# Patient Record
Sex: Male | Born: 1990 | Race: White | Hispanic: No | Marital: Single | Smoking: Never smoker
Health system: Southern US, Community
[De-identification: ages and names within clinical notes are randomized; demographics above are authoritative.]

---
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
## 2009-09-28 HISTORY — PX: BACK SURGERY: SHX140

## 2015-10-23 ENCOUNTER — Encounter: Payer: Self-pay | Admitting: Emergency Medicine

## 2015-10-23 ENCOUNTER — Emergency Department
Admission: EM | Admit: 2015-10-23 | Discharge: 2015-10-23 | Disposition: A | Discharge status: Home / Self Care | Payer: BLUE CROSS/BLUE SHIELD | Attending: Emergency Medicine | Admitting: Emergency Medicine

## 2015-10-23 ENCOUNTER — Emergency Department: Payer: BLUE CROSS/BLUE SHIELD

## 2015-10-23 DIAGNOSIS — R1031 Right lower quadrant pain: Secondary | ICD-10-CM | POA: Insufficient documentation

## 2015-10-23 DIAGNOSIS — R39198 Other difficulties with micturition: Secondary | ICD-10-CM | POA: Diagnosis not present

## 2015-10-23 DIAGNOSIS — Z79899 Other long term (current) drug therapy: Secondary | ICD-10-CM | POA: Diagnosis not present

## 2015-10-23 LAB — COMPREHENSIVE METABOLIC PANEL
ALBUMIN: 4.8 g/dL (ref 3.5–5.0)
ALK PHOS: 64 U/L (ref 38–126)
ALT: 19 U/L (ref 17–63)
ANION GAP: 6 (ref 5–15)
AST: 22 U/L (ref 15–41)
BILIRUBIN TOTAL: 0.8 mg/dL (ref 0.3–1.2)
BUN: 20 mg/dL (ref 6–20)
CALCIUM: 9.1 mg/dL (ref 8.9–10.3)
CO2: 24 mmol/L (ref 22–32)
Chloride: 104 mmol/L (ref 101–111)
Creatinine, Ser: 1.13 mg/dL (ref 0.61–1.24)
GFR calc non Af Amer: >60 mL/min (ref >60)
GLUCOSE: 103 mg/dL — AB (ref 65–99)
POTASSIUM: 3.8 mmol/L (ref 3.5–5.1)
SODIUM: 134 mmol/L — AB (ref 135–145)
TOTAL PROTEIN: 8.3 g/dL — AB (ref 6.5–8.1)

## 2015-10-23 LAB — CBC WITH DIFFERENTIAL/PLATELET
BASOS ABS: 0.1 10*3/uL (ref 0–0.1)
Basophils Relative: 1 %
EOS PCT: 7 %
Eosinophils Absolute: 0.4 10*3/uL (ref 0–0.7)
HCT: 45 % (ref 40.0–52.0)
Hemoglobin: 15.7 g/dL (ref 13.0–18.0)
LYMPHS PCT: 31 %
Lymphs Abs: 1.9 10*3/uL (ref 1.0–3.6)
MCH: 29 pg (ref 26.0–34.0)
MCHC: 34.8 g/dL (ref 32.0–36.0)
MCV: 83.2 fL (ref 80.0–100.0)
Monocytes Absolute: 0.3 10*3/uL (ref 0.2–1.0)
Monocytes Relative: 5 %
NEUTROS ABS: 3.4 10*3/uL (ref 1.4–6.5)
Neutrophils Relative %: 56 %
Platelets: 291 10*3/uL (ref 150–440)
RBC: 5.4 MIL/uL (ref 4.40–5.90)
RDW: 12.7 % (ref 11.5–14.5)
WBC: 6.1 10*3/uL (ref 3.8–10.6)

## 2015-10-23 LAB — URINALYSIS COMPLETE WITH MICROSCOPIC (ARMC ONLY)
BACTERIA UA: NONE SEEN
Bilirubin Urine: NEGATIVE
Glucose, UA: NEGATIVE mg/dL
Hgb urine dipstick: NEGATIVE
Ketones, ur: NEGATIVE mg/dL
Leukocytes, UA: NEGATIVE
Nitrite: NEGATIVE
PROTEIN: NEGATIVE mg/dL
SPECIFIC GRAVITY, URINE: 1.023 (ref 1.005–1.030)
SQUAMOUS EPITHELIAL / LPF: NONE SEEN
WBC UA: NONE SEEN WBC/hpf (ref 0–5)
pH: 8 (ref 5.0–8.0)

## 2015-10-23 LAB — LIPASE, BLOOD: LIPASE: 23 U/L (ref 11–51)

## 2015-10-23 MED ORDER — IBUPROFEN 100 MG/5ML PO SUSP: 400.0000 mg | ORAL | Status: DC | PRN

## 2015-10-23 MED ORDER — SODIUM CHLORIDE 0.9 % IV BOLUS (SEPSIS)
1000.0000 mL | Freq: Once | INTRAVENOUS | Status: AC
  Administered 2015-10-23: 1000 mL via INTRAVENOUS

## 2015-10-23 NOTE — Discharge Instructions (Signed)
Abdominal Pain, Adult °Many things can cause abdominal pain. Usually, abdominal pain is not caused by a disease and will improve without treatment. It can often be observed and treated at home. Your health care provider will do a physical exam and possibly order blood tests and X-rays to help determine the seriousness of your pain. However, in many cases, more time must pass before a clear cause of the pain can be found. Before that point, your health care provider Dayrit not know if you need more testing or further treatment. °HOME CARE INSTRUCTIONS °Monitor your abdominal pain for any changes. The following actions Horne help to alleviate any discomfort you are experiencing: °· Only take over-the-counter or prescription medicines as directed by your health care provider. °· Do not take laxatives unless directed to do so by your health care provider. °· Try a clear liquid diet (broth, tea, or water) as directed by your health care provider. Slowly move to a bland diet as tolerated. °SEEK MEDICAL CARE IF: °· You have unexplained abdominal pain. °· You have abdominal pain associated with nausea or diarrhea. °· You have pain when you urinate or have a bowel movement. °· You experience abdominal pain that wakes you in the night. °· You have abdominal pain that is worsened or improved by eating food. °· You have abdominal pain that is worsened with eating fatty foods. °· You have a fever. °SEEK IMMEDIATE MEDICAL CARE IF: °· Your pain does not go away within 2 hours. °· You keep throwing up (vomiting). °· Your pain is felt only in portions of the abdomen, such as the right side or the left lower portion of the abdomen. °· You pass bloody or black tarry stools. °MAKE SURE YOU: °· Understand these instructions. °· Will watch your condition. °· Will get help right away if you are not doing well or get worse. °  °This information is not intended to replace advice given to you by your health care provider. Make sure you discuss  any questions you have with your health care provider. °  °Document Released: 06/24/2005 Document Revised: 06/05/2015 Document Reviewed: 05/24/2013 °Elsevier Interactive Patient Education ©2016 [employer] ° °

## 2015-10-23 NOTE — ED Notes (Signed)
Pt to ed with c/o right flank pain and right groin pain x 3 days.  Pt reports increased difficulty with urination.

## 2015-10-23 NOTE — ED Provider Notes (Signed)
[facility] Emergency Department Provider Note  ____________________________________________  Time seen: Approximately 8 AM   I have reviewed the triage vital signs and the nursing notes.   HISTORY  Chief Complaint Groin Pain and Flank Pain    HPI Angel Montgomery is a 25 y.o. male without any chronic medical problems is presenting with 2-3 days of right flank and groin pain. He says he has also had difficulty urinating but denies any blood in his urine. He does have a strong history of kidney stones in his family. He denies any nausea or vomiting. Says that the pain is worsened with movement. Denies any diarrhea. 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 He denies seeing any bulges or obvious hernias. He says the pain as a 5 out of 10 right now but becomes sharp at times lasting for 10-15 minutes.     History reviewed. No pertinent past medical history.  There are no active problems to display for this patient.   History reviewed. No pertinent past surgical history.  Current Outpatient Rx  Name  Route  Sig  Dispense  Refill  . loratadine (CLARITIN) 10 MG tablet   Oral   Take 10 mg by mouth daily.           Allergies Review of patient's allergies indicates no known allergies.  History reviewed. No pertinent family history.  Social History Social History  Substance Use Topics  . Smoking status: Never Smoker   . Smokeless tobacco: None  . Alcohol Use: No    Review of Systems Constitutional: No fever/chills Eyes: No visual changes. ENT: No sore throat. Cardiovascular: Denies chest pain. Respiratory: Denies shortness of breath. Gastrointestinal: No nausea, no vomiting.  No diarrhea. He was also having some recent issues with constipation but took a dose of magnesium citrate which resolved the issue. Genitourinary: Negative for any burning or frequency on  urination. Musculoskeletal: Negative for back pain. Skin: Negative for rash. Neurological: Negative for headaches, focal weakness or numbness.  10-point ROS otherwise negative.  ____________________________________________   PHYSICAL EXAM:  VITAL SIGNS: ED Triage Vitals  Enc Vitals Group     BP 10/23/15 0756 142/76 mmHg     Pulse Rate 10/23/15 0756 99     Resp 10/23/15 0756 18     Temp 10/23/15 0756 98 F (36.7 C)     Temp Source 10/23/15 0756 Oral     SpO2 10/23/15 0756 99 %     Weight 10/23/15 0756 200 lb (90.719 kg)     Height 10/23/15 0756  (1.753 m)     Head Cir --      Peak Flow --      Pain Score 10/23/15 0753 6     Pain Loc --      Pain Edu? --      Excl. in GC? --     Constitutional: Alert and oriented. Well appearing and in no acute distress. Eyes: Conjunctivae are normal. PERRL. EOMI. Head: Atraumatic. Nose: No congestion/rhinnorhea. Mouth/Throat: Mucous membranes are moist.   Neck: No stridor.   Cardiovascular: Normal rate, regular rhythm. Grossly normal heart sounds.  Good peripheral circulation. Respiratory: Normal respiratory effort.  No retractions. Lungs CTAB. Gastrointestinal: Soft with mild right lower quadrant tenderness to palpation. No distention. No abdominal bruits. No CVA tenderness. Genitourinary: Normal external examination of the circumcised male. No obvious palpable or visible direct or indirect  inguinal hernia. No tenderness to the scrotum, testicles or masses. Musculoskeletal: No lower extremity tenderness nor edema.  No joint effusions. Neurologic:  Normal speech and language. No gross focal neurologic deficits are appreciated. No gait instability. Skin:  Skin is warm, dry and intact. No rash noted. Psychiatric: Mood and affect are normal. Speech and behavior are normal.  ____________________________________________   LABS (all labs ordered are listed, but only abnormal results are displayed)  Labs Reviewed  URINALYSIS  COMPLETEWITH MICROSCOPIC ([facility] ONLY) - Abnormal; Notable for the following:    Color, Urine YELLOW (*)    APPearance CLEAR (*)    All other components within normal limits  COMPREHENSIVE METABOLIC PANEL - Abnormal; Notable for the following:    Sodium 134 (*)    Glucose, Bld 103 (*)    Total Protein 8.3 (*)    All other components within normal limits  CBC WITH DIFFERENTIAL/PLATELET  LIPASE, BLOOD   ____________________________________________  EKG   ____________________________________________  RADIOLOGY  No acute abnormality seen on the CAT scan of the abdomen and pelvis. ____________________________________________   PROCEDURES    ____________________________________________   INITIAL IMPRESSION / ASSESSMENT AND PLAN / ED COURSE  Pertinent labs & imaging results that were available during my care of the patient were reviewed by me and considered in my medical decision making (see chart for details).  ----------------------------------------- 9:47 AM on 10/23/2015 -----------------------------------------  Patient is resting comfortably at this time. Updated him as well as his family about the lab as well as imaging results. Very reassuring workup at this time. Feel that the etiology is most likely abdominal wall pain. Possibly from the patient's job where he does a lot of lifting. Advised him to rest and to use ibuprofen as well as muscle cream such as Aspercreme or icy hot. He knows to return for any worsening or concerning symptoms. Will follow up with primary care doctor. ____________________________________________   FINAL CLINICAL IMPRESSION(S) / ED DIAGNOSES  Final diagnoses:  Right lower quadrant abdominal pain      Myrna Blazer, MD 10/23/15 [phone]

## 2016-06-06 ENCOUNTER — Encounter: Payer: Self-pay | Admitting: Emergency Medicine

## 2016-06-06 ENCOUNTER — Emergency Department
Admission: EM | Admit: 2016-06-06 | Discharge: 2016-06-06 | Disposition: A | Discharge status: Home / Self Care | Payer: BLUE CROSS/BLUE SHIELD | Attending: Emergency Medicine | Admitting: Emergency Medicine

## 2016-06-06 ENCOUNTER — Emergency Department: Payer: BLUE CROSS/BLUE SHIELD

## 2016-06-06 DIAGNOSIS — K219 Gastro-esophageal reflux disease without esophagitis: Secondary | ICD-10-CM | POA: Diagnosis not present

## 2016-06-06 DIAGNOSIS — R079 Chest pain, unspecified: Secondary | ICD-10-CM | POA: Diagnosis present

## 2016-06-06 LAB — BASIC METABOLIC PANEL
Anion gap: 6 (ref 5–15)
BUN: 17 mg/dL (ref 6–20)
CHLORIDE: 104 mmol/L (ref 101–111)
CO2: 27 mmol/L (ref 22–32)
CREATININE: 0.98 mg/dL (ref 0.61–1.24)
Calcium: 9.8 mg/dL (ref 8.9–10.3)
Glucose, Bld: 99 mg/dL (ref 65–99)
POTASSIUM: 3.9 mmol/L (ref 3.5–5.1)
SODIUM: 137 mmol/L (ref 135–145)

## 2016-06-06 LAB — CBC
HEMATOCRIT: 46.5 % (ref 40.0–52.0)
Hemoglobin: 16.5 g/dL (ref 13.0–18.0)
MCH: 29.8 pg (ref 26.0–34.0)
MCHC: 35.5 g/dL (ref 32.0–36.0)
MCV: 84 fL (ref 80.0–100.0)
PLATELETS: 304 10*3/uL (ref 150–440)
RBC: 5.53 MIL/uL (ref 4.40–5.90)
RDW: 12.3 % (ref 11.5–14.5)
WBC: 7.7 10*3/uL (ref 3.8–10.6)

## 2016-06-06 LAB — TROPONIN I: Troponin I: <0.03 ng/mL (ref <0.03)

## 2016-06-06 MED ORDER — FAMOTIDINE 40 MG/5ML PO SUSR: 20.0000 mg | Freq: Two times a day (BID) | ORAL | 0 refills | Status: DC

## 2016-06-06 MED ORDER — GI COCKTAIL ~~LOC~~
30.0000 mL | Freq: Once | ORAL | Status: AC
  Administered 2016-06-06: 30 mL via ORAL
  Filled 2016-06-06: qty 30

## 2016-06-06 NOTE — ED Notes (Signed)
Ok for flex per chuck PA

## 2016-06-06 NOTE — ED Provider Notes (Signed)
[facility] Emergency Department Provider Note   ____________________________________________   First MD Initiated Contact with Patient 06/06/16 1738     (approximate)  I have reviewed the triage vital signs and the nursing notes.   HISTORY  Chief Complaint Chest Pain   HPI Angel Montgomery is a 25 y.o. male who presents to the emergency department for evaluation of midsternal chest pain. Pain has been present off and on for several months. Pain is worse after eating and at night when lying down. He has not attempted to take any medications for this pain.  History reviewed. No pertinent past medical history.  There are no active problems to display for this patient.   History reviewed. No pertinent surgical history.  Prior to Admission medications   Medication Sig Start Date End Date Taking? Authorizing Provider  famotidine (PEPCID) 40 MG/5ML suspension Take 2.5 mLs (20 mg total) by mouth 2 (two) times daily. 06/06/16   Chinita Pester, FNP  ibuprofen (CHILDRENS IBUPROFEN) 100 MG/5ML suspension Take 20 mLs (400 mg total) by mouth every 4 (four) hours as needed for mild pain or moderate pain. 10/23/15   Myrna Blazer, MD  loratadine (CLARITIN) 10 MG tablet Take 10 mg by mouth daily.    Historical Provider, MD    Allergies Review of patient's allergies indicates no known allergies.  History reviewed. No pertinent family history.  Social History Social History  Substance Use Topics  . Smoking status: Never Smoker  . Smokeless tobacco: Not on file  . Alcohol use No    Review of Systems Constitutional: No fever/chills Eyes: No visual changes. ENT: No sore throat. Cardiovascular: Positive for chest pain. Respiratory: Denies shortness of breath. Gastrointestinal: Positive for epigastric pain. Negative for nausea or vomiting. Negative for diarrhea or constipation. Genitourinary: Negative for dysuria. Musculoskeletal: Negative for back  pain. Skin: Negative for rash.  ____________________________________________   PHYSICAL EXAM:  VITAL SIGNS: ED Triage Vitals  Enc Vitals Group     BP 06/06/16 1620 135/77     Pulse Rate 06/06/16 1620 96     Resp 06/06/16 1620 20     Temp 06/06/16 1620 98.2 F (36.8 C)     Temp Source 06/06/16 1620 Oral     SpO2 06/06/16 1620 98 %     Weight 06/06/16 1611 200 lb (90.7 kg)     Height 06/06/16 1611 5\' 9"  (1.753 m)     Head Circumference --      Peak Flow --      Pain Score 06/06/16 1612 4     Pain Loc --      Pain Edu? --      Excl. in GC? --     Constitutional: Alert and oriented. Well appearing and in no acute distress. Eyes: Conjunctivae are normal. PERRL. EOMI. Head: Atraumatic. Nose: No congestion/rhinnorhea. Mouth/Throat: Mucous membranes are moist.  Oropharynx non-erythematous. Cardiovascular: Normal rate, regular rhythm. Grossly normal heart sounds.  Good peripheral circulation. Respiratory: Normal respiratory effort.  No retractions. Lungs CTAB. Gastrointestinal: Soft and nontender. No distention. No abdominal bruits.  Musculoskeletal: Full range of motion throughout. Ambulatory without assistance.  ____________________________________________   LABS (all labs ordered are listed, but only abnormal results are displayed)  Labs Reviewed  BASIC METABOLIC PANEL  CBC  TROPONIN I   ____________________________________________  EKG  Sinus rhythm with a ventricular rate of 103 beats per minute. Normal QT, QRS, and PR intervals. No ST depression or elevation. Interpretation: Sinus tachycardia  ____________________________________________  RADIOLOGY  Chest x-ray negative for acute cardiopulmonary abnormality per radiology.  ____________________________________________   PROCEDURES  Procedure(s) performed: None  Procedures  Critical Care performed: No  ____________________________________________   INITIAL IMPRESSION / ASSESSMENT AND PLAN / ED  COURSE  Pertinent labs & imaging results that were available during my care of the patient were reviewed by me and considered in my medical decision making (see chart for details).  GI cocktail given in the emergency department with moderate relief of symptoms. Prescription for Pepcid will be given and he will be advised to follow-up with the primary care provider of his choice. He was instructed to return to the emergency department for symptoms that change or worsen if he is unable to schedule an appointment.  Clinical Course     ____________________________________________   FINAL CLINICAL IMPRESSION(S) / ED DIAGNOSES  Final diagnoses:  Gastroesophageal reflux disease, esophagitis presence not specified      NEW MEDICATIONS STARTED DURING THIS VISIT:  Discharge Medication List as of 06/06/2016  6:41 PM    START taking these medications   Details  famotidine (PEPCID) 40 MG/5ML suspension Take 2.5 mLs (20 mg total) by mouth 2 (two) times daily., Starting Sat 06/06/2016, Print         Note:  This document was prepared using Dragon voice recognition software and Episcopo include unintentional dictation errors.    Chinita PesterCari B Genifer Lazenby, FNP 06/06/16 1853    Minna AntisKevin Paduchowski, MD 06/06/16 2255

## 2016-06-06 NOTE — ED Notes (Signed)
Pt states upper abd pain for 1 month, pain comes and go, pt states increased pain when eating and increased belching, denies any vomiting, pt awake and alert in no acute distress

## 2016-06-06 NOTE — ED Triage Notes (Signed)
Pt has had central chest pain that was intermittent and now is constant. C/o SHOB. Denies fevers or cough.

## 2016-07-15 ENCOUNTER — Other Ambulatory Visit: Payer: Self-pay | Admitting: Unknown Physician Specialty

## 2016-07-15 DIAGNOSIS — M5416 Radiculopathy, lumbar region: Secondary | ICD-10-CM

## 2016-07-29 ENCOUNTER — Ambulatory Visit: Payer: BLUE CROSS/BLUE SHIELD

## 2016-08-04 ENCOUNTER — Ambulatory Visit
Admission: RE | Admit: 2016-08-04 | Discharge: 2016-08-04 | Disposition: A | Discharge status: Home / Self Care | Payer: BLUE CROSS/BLUE SHIELD | Source: Ambulatory Visit | Attending: Unknown Physician Specialty | Admitting: Unknown Physician Specialty

## 2016-08-04 DIAGNOSIS — M5416 Radiculopathy, lumbar region: Secondary | ICD-10-CM

## 2016-08-14 ENCOUNTER — Ambulatory Visit: Payer: BLUE CROSS/BLUE SHIELD

## 2016-09-01 ENCOUNTER — Ambulatory Visit: Admission: RE | Admit: 2016-09-01 | Payer: BLUE CROSS/BLUE SHIELD | Source: Ambulatory Visit

## 2016-09-02 ENCOUNTER — Ambulatory Visit: Payer: BLUE CROSS/BLUE SHIELD

## 2016-09-09 ENCOUNTER — Ambulatory Visit
Admission: RE | Admit: 2016-09-09 | Discharge: 2016-09-09 | Disposition: A | Discharge status: Home / Self Care | Payer: BLUE CROSS/BLUE SHIELD | Source: Ambulatory Visit | Attending: Unknown Physician Specialty | Admitting: Unknown Physician Specialty

## 2016-09-09 DIAGNOSIS — M5127 Other intervertebral disc displacement, lumbosacral region: Secondary | ICD-10-CM | POA: Diagnosis not present

## 2016-09-09 DIAGNOSIS — M5416 Radiculopathy, lumbar region: Secondary | ICD-10-CM | POA: Insufficient documentation

## 2019-06-24 ENCOUNTER — Emergency Department: Payer: BLUE CROSS/BLUE SHIELD

## 2019-06-24 ENCOUNTER — Emergency Department
Admission: EM | Admit: 2019-06-24 | Discharge: 2019-06-24 | Disposition: A | Discharge status: Home / Self Care | Payer: BLUE CROSS/BLUE SHIELD | Attending: Emergency Medicine | Admitting: Emergency Medicine

## 2019-06-24 ENCOUNTER — Other Ambulatory Visit: Payer: Self-pay

## 2019-06-24 ENCOUNTER — Encounter: Payer: Self-pay | Admitting: Emergency Medicine

## 2019-06-24 DIAGNOSIS — M25511 Pain in right shoulder: Secondary | ICD-10-CM | POA: Insufficient documentation

## 2019-06-24 MED ORDER — CYCLOBENZAPRINE HCL 10 MG PO TABS: 10.0000 mg | ORAL_TABLET | Freq: Three times a day (TID) | ORAL | 0 refills | Status: DC | PRN

## 2019-06-24 MED ORDER — IBUPROFEN 600 MG PO TABS: 600.0000 mg | ORAL_TABLET | Freq: Three times a day (TID) | ORAL | 0 refills | Status: DC | PRN

## 2019-06-24 MED ORDER — METHYLPREDNISOLONE 4 MG PO TBPK: ORAL_TABLET | ORAL | 0 refills | Status: DC

## 2019-06-24 MED ORDER — TRAMADOL HCL 50 MG PO TABS: 50.0000 mg | ORAL_TABLET | Freq: Four times a day (QID) | ORAL | 0 refills | Status: DC | PRN

## 2019-06-24 NOTE — ED Notes (Signed)
Pt c/o right shoulder pain x1 week. Pt reports last night when trying to take his shirt off he felt a pop and has not been able to move his arm since. Pt denies any hx of injuring his right shoulder.

## 2019-06-24 NOTE — Discharge Instructions (Signed)
Follow discharge care instructions and wear arm sling for 2 to 3 days.  Follow-up with orthopedics clinic if no improvement.

## 2019-06-24 NOTE — ED Triage Notes (Signed)
Pain R shoulder x 6 days.

## 2019-06-24 NOTE — ED Provider Notes (Signed)
[facility] Emergency Department Provider Note   ____________________________________________   First MD Initiated Contact with Patient 06/24/19 [phone]     (approximate)  I have reviewed the triage vital signs and the nursing notes.   HISTORY  Chief Complaint Shoulder Pain    HPI Angel Montgomery is a 28 y.o. male patient complains of atraumatic right shoulder pain that increased with overhead reaching and abduction.  Patient states he felt a "pop" at the [facility] joint 3 days ago.  Patient complained of radicular neck pain resulting in numbness from the right upper arm to the fingertips.  Patient rates his pain as a 8/10.  Patient is right-hand dominant.  No palliative measure for complaint.     History reviewed. No pertinent past medical history.  There are no active problems to display for this patient.   History reviewed. No pertinent surgical history.  Prior to Admission medications   Medication Sig Start Date End Date Taking? Authorizing Provider  cyclobenzaprine (FLEXERIL) 10 MG tablet Take 1 tablet (10 mg total) by mouth 3 (three) times daily as needed. 06/24/19   Joni Reining, PA-C  famotidine (PEPCID) 40 MG/5ML suspension Take 2.5 mLs (20 mg total) by mouth 2 (two) times daily. 06/06/16   Triplett, Cari B, FNP  ibuprofen (ADVIL) 600 MG tablet Take 1 tablet (600 mg total) by mouth every 8 (eight) hours as needed. 06/24/19   Joni Reining, PA-C  ibuprofen (CHILDRENS IBUPROFEN) 100 MG/5ML suspension Take 20 mLs (400 mg total) by mouth every 4 (four) hours as needed for mild pain or moderate pain. 10/23/15   Myrna Blazer, MD  loratadine (CLARITIN) 10 MG tablet Take 10 mg by mouth daily.    [provider]  traMADol (ULTRAM) 50 MG tablet Take 1 tablet (50 mg total) by mouth every 6 (six) hours as needed. 06/24/19 06/23/20  Joni Reining, PA-C    Allergies Patient has no known allergies.  No family history on file.  Social History  Social History   Tobacco Use  . Smoking status: Never Smoker  Substance Use Topics  . Alcohol use: No  . Drug use: No    Review of Systems Constitutional: No fever/chills Eyes: No visual changes. ENT: No sore throat. Cardiovascular: Denies chest pain. Respiratory: Denies shortness of breath. Gastrointestinal: No abdominal pain.  No nausea, no vomiting.  No diarrhea.  No constipation. Genitourinary: Negative for dysuria. Musculoskeletal: Right upper arm pain. Skin: Negative for rash. Neurological: Tingling numbness to the right upper extremity.    ____________________________________________   PHYSICAL EXAM:  VITAL SIGNS: ED Triage Vitals  Enc Vitals Group     BP 06/24/19 0948 132/88     Pulse Rate 06/24/19 0948 90     Resp 06/24/19 0948 18     Temp 06/24/19 0948 97.9 F (36.6 C)     Temp Source 06/24/19 0948 Oral     SpO2 06/24/19 0948 100 %     Weight --      Height --      Head Circumference --      Peak Flow --      Pain Score 06/24/19 0949 8     Pain Loc --      Pain Edu? --      Excl. in GC? --    Constitutional: Alert and oriented. Well appearing and in no acute distress. Neck:   No cervical spine tenderness to palpation. Cardiovascular: Normal rate, regular rhythm. Grossly normal  heart sounds.  Good peripheral circulation. Respiratory: Normal respiratory effort.  No retractions. Lungs CTAB. Musculoskeletal: No obvious deformity to the right shoulder.  Patient decreased range of motion abduction and overhead reaching.   Neurologic:  Normal speech and language. No gross focal neurologic deficits are appreciated. No gait instability. Skin:  Skin is warm, dry and intact. No rash noted. Psychiatric: Mood and affect are normal. Speech and behavior are normal.  ____________________________________________   LABS (all labs ordered are listed, but only abnormal results are displayed)  Labs Reviewed - No data to display  ____________________________________________  EKG   ____________________________________________  RADIOLOGY  ED MD interpretation:    Official radiology report(s): Dg Cervical Spine Complete  Result Date: 06/24/2019 CLINICAL DATA:  28 year old male with a history of neck pain EXAM: CERVICAL SPINE - COMPLETE 4+ VIEW COMPARISON:  None. FINDINGS: Cervical Spine: Cervical elements maintain relative anatomic alignment from the level of C1-T1. Unremarkable appearance of the craniocervical junction. No subluxation, anterolisthesis, retrolisthesis. No acute fracture line identified. Vertebral body heights maintained. Disc spaces maintained, without significant disc disease. Oblique images demonstrate no foraminal encroachment. No significant facet disease. Prevertebral soft tissues within normal limits. Open mouth odontoid view unremarkable. IMPRESSION: Negative cervical spine radiographs. Electronically Signed   By: Gilmer MorJaime  Wagner D.O.   On: 06/24/2019 11:07   Dg Shoulder Right  Result Date: 06/24/2019 CLINICAL DATA:  28 year old male with a history of shoulder pain EXAM: RIGHT SHOULDER - 2+ VIEW COMPARISON:  None. FINDINGS: There is no evidence of fracture or dislocation. There is no evidence of arthropathy or other focal bone abnormality. Soft tissues are unremarkable. IMPRESSION: Negative. Electronically Signed   By: Gilmer MorJaime  Wagner D.O.   On: 06/24/2019 11:07    ____________________________________________   PROCEDURES  Procedure(s) performed (including Critical Care):  Procedures   ____________________________________________   INITIAL IMPRESSION / ASSESSMENT AND PLAN / ED COURSE  As part of my medical decision making, I reviewed the following data within the electronic MEDICAL RECORD NUMBER         Angel Montgomery was evaluated in Emergency Department on 06/24/2019 for the symptoms described in the history of present illness. He was evaluated in the context of the global COVID-19  pandemic, which necessitated consideration that the patient might be at risk for infection with the SARS-CoV-2 virus that causes COVID-19. Institutional protocols and algorithms that pertain to the evaluation of patients at risk for COVID-19 are in a state of rapid change based on information released by regulatory bodies including the CDC and federal and state organizations. These policies and algorithms were followed during the patient's care in the ED.  Patient presents with 3 days of atraumatic right shoulder pain.  Physical exam is grossly unremarkable except for decreased range of motion the right upper extremity.  Discussed neck x-ray findings with patient.  Patient placed in arm sling for comfort.  Patient given discharge care instruct advised follow orthopedic if no improvement in 3 to 5 days.      ____________________________________________   FINAL CLINICAL IMPRESSION(S) / ED DIAGNOSES  Final diagnoses:  Acute pain of right shoulder     ED Discharge Orders         Ordered    traMADol (ULTRAM) 50 MG tablet  Every 6 hours PRN     06/24/19 1123    cyclobenzaprine (FLEXERIL) 10 MG tablet  3 times daily PRN     06/24/19 1123    ibuprofen (ADVIL) 600 MG tablet  Every 8 hours PRN  06/24/19 1123           Note:  This document was prepared using Dragon voice recognition software and Vila include unintentional dictation errors.    Sable Feil, PA-C 06/24/19 1127    Vanessa [place], MD 06/24/19 [phone]

## 2020-01-19 ENCOUNTER — Ambulatory Visit: Payer: BLUE CROSS/BLUE SHIELD | Admitting: Internal Medicine

## 2020-01-19 DIAGNOSIS — Z0289 Encounter for other administrative examinations: Secondary | ICD-10-CM

## 2020-01-19 NOTE — Progress Notes (Deleted)
HPI  No past medical history on file.  Current Outpatient Medications  Medication Sig Dispense Refill  . cyclobenzaprine (FLEXERIL) 10 MG tablet Take 1 tablet (10 mg total) by mouth 3 (three) times daily as needed. 15 tablet 0  . famotidine (PEPCID) 40 MG/5ML suspension Take 2.5 mLs (20 mg total) by mouth 2 (two) times daily. 75 mL 0  . ibuprofen (ADVIL) 600 MG tablet Take 1 tablet (600 mg total) by mouth every 8 (eight) hours as needed. 15 tablet 0  . ibuprofen (CHILDRENS IBUPROFEN) 100 MG/5ML suspension Take 20 mLs (400 mg total) by mouth every 4 (four) hours as needed for mild pain or moderate pain. 160 mL 0  . loratadine (CLARITIN) 10 MG tablet Take 10 mg by mouth daily.    . methylPREDNISolone (MEDROL DOSEPAK) 4 MG TBPK tablet Take Tapered dose as directed 21 tablet 0  . traMADol (ULTRAM) 50 MG tablet Take 1 tablet (50 mg total) by mouth every 6 (six) hours as needed. 20 tablet 0   No current facility-administered medications for this visit.    No Known Allergies  No family history on file.  Social History   Socioeconomic History  . Marital status: Single    Spouse name: Not on file  . Number of children: Not on file  . Years of education: Not on file  . Highest education level: Not on file  Occupational History  . Not on file  Tobacco Use  . Smoking status: Never Smoker  Substance and Sexual Activity  . Alcohol use: No  . Drug use: No  . Sexual activity: Not on file  Other Topics Concern  . Not on file  Social History Narrative  . Not on file   Social Determinants of Health   Financial Resource Strain:   . Difficulty of Paying Living Expenses:   Food Insecurity:   . Worried About Programme researcher, broadcasting/film/video in the Last Year:   . Barista in the Last Year:   Transportation Needs:   . Freight forwarder (Medical):   Marland Kitchen Lack of Transportation (Non-Medical):   Physical Activity:   . Days of Exercise per Week:   . Minutes of Exercise per Session:   Stress:    . Feeling of Stress :   Social Connections:   . Frequency of Communication with Friends and Family:   . Frequency of Social Gatherings with Friends and Family:   . Attends Religious Services:   . Active Member of Clubs or Organizations:   . Attends Banker Meetings:   Marland Kitchen Marital Status:   Intimate Partner Violence:   . Fear of Current or Ex-Partner:   . Emotionally Abused:   Marland Kitchen Physically Abused:   . Sexually Abused:     ROS:  Constitutional: Denies fever, malaise, fatigue, headache or abrupt weight changes.  HEENT: Denies eye pain, eye redness, ear pain, ringing in the ears, wax buildup, runny nose, nasal congestion, bloody nose, or sore throat. Respiratory: Denies difficulty breathing, shortness of breath, cough or sputum production.   Cardiovascular: Denies chest pain, chest tightness, palpitations or swelling in the hands or feet.  Gastrointestinal: Denies abdominal pain, bloating, constipation, diarrhea or blood in the stool.  GU: Denies frequency, urgency, pain with urination, blood in urine, odor or discharge. Musculoskeletal: Denies decrease in range of motion, difficulty with gait, muscle pain or joint pain and swelling.  Skin: Denies redness, rashes, lesions or ulcercations.  Neurological: Denies dizziness, difficulty with memory, difficulty  with speech or problems with balance and coordination.  Psych: Denies anxiety, depression, SI/HI.  No other specific complaints in a complete review of systems (except as listed in HPI above).  PE:  There were no vitals taken for this visit. Wt Readings from Last 3 Encounters:  06/06/16 200 lb (90.7 kg)  10/23/15 200 lb (90.7 kg)    General: Appears their stated age, well developed, well nourished in NAD. HEENT: Head: normal shape and size; Eyes: sclera white, no icterus, conjunctiva pink, PERRLA and EOMs intact; Ears: Tm's gray and intact, normal light reflex;Throat/Mouth: Teeth present, mucosa pink and moist, no  lesions or ulcerations noted.  Neck: Neck supple, trachea midline. No masses, lumps or thyromegaly present.  Cardiovascular: Normal rate and rhythm. S1,S2 noted.  No murmur, rubs or gallops noted. No JVD or BLE edema. No carotid bruits noted. Pulmonary/Chest: Normal effort and positive vesicular breath sounds. No respiratory distress. No wheezes, rales or ronchi noted.  Abdomen: Soft and nontender. Normal bowel sounds, no bruits noted. No distention or masses noted. Liver, spleen and kidneys non palpable. Musculoskeletal: Normal range of motion. Strength 5/5 BUE/BLE. No signs of joint swelling. No difficulty with gait.  Neurological: Alert and oriented. Cranial nerves II-XII grossly intact. Coordination normal.  Psychiatric: Mood and affect normal. Behavior is normal. Judgment and thought content normal.   EKG:  BMET    Component Value Date/Time   NA 137 06/06/2016 1620   K 3.9 06/06/2016 1620   CL 104 06/06/2016 1620   CO2 27 06/06/2016 1620   GLUCOSE 99 06/06/2016 1620   BUN 17 06/06/2016 1620   CREATININE 0.98 06/06/2016 1620   CALCIUM 9.8 06/06/2016 1620   GFRNONAA >60 06/06/2016 1620   GFRAA >60 06/06/2016 1620    Lipid Panel  No results found for: CHOL, TRIG, HDL, CHOLHDL, VLDL, LDLCALC  CBC    Component Value Date/Time   WBC 7.7 06/06/2016 1620   RBC 5.53 06/06/2016 1620   HGB 16.5 06/06/2016 1620   HCT 46.5 06/06/2016 1620   PLT 304 06/06/2016 1620   MCV 84.0 06/06/2016 1620   MCH 29.8 06/06/2016 1620   MCHC 35.5 06/06/2016 1620   RDW 12.3 06/06/2016 1620   LYMPHSABS 1.9 10/23/2015 0816   MONOABS 0.3 10/23/2015 0816   EOSABS 0.4 10/23/2015 0816   BASOSABS 0.1 10/23/2015 0816    Hgb A1C No results found for: HGBA1C   Assessment and Plan:   Webb Silversmith, NP This visit occurred during the SARS-CoV-2 public health emergency.  Safety protocols were in place, including screening questions prior to the visit, additional usage of staff PPE, and extensive  cleaning of exam room while observing appropriate contact time as indicated for disinfecting solutions.

## 2020-01-22 ENCOUNTER — Telehealth: Payer: Self-pay | Admitting: General Practice

## 2020-01-22 NOTE — Telephone Encounter (Signed)
Still needs to be charged NSF.

## 2020-01-22 NOTE — Telephone Encounter (Signed)
FYI- Patient's mother called today She just wanted you to be aware of what happened with the patient's NP appointment, She said that she tried calling multiple times on 4/23 but it kept telling her that our phone systems where done.  Patient's dad was just moved our of the covid floor at the hospital and they were able to go seen him and this was the reason for the cancellation.

## 2020-02-01 ENCOUNTER — Other Ambulatory Visit: Payer: Self-pay

## 2020-02-01 ENCOUNTER — Ambulatory Visit: Payer: 59 | Admitting: Internal Medicine

## 2020-02-01 ENCOUNTER — Encounter: Payer: Self-pay | Admitting: Internal Medicine

## 2020-02-01 DIAGNOSIS — F329 Major depressive disorder, single episode, unspecified: Secondary | ICD-10-CM | POA: Diagnosis not present

## 2020-02-01 DIAGNOSIS — F419 Anxiety disorder, unspecified: Secondary | ICD-10-CM

## 2020-02-01 MED ORDER — FLUOXETINE HCL 20 MG/5ML PO SOLN: 10.0000 mg | Freq: Every day | ORAL | 2 refills | Status: DC

## 2020-02-01 NOTE — Progress Notes (Signed)
HPI  Pt presents to the clinic today to establish care. He has not had a PCP in many years.  Depression: This is a recent development. It is situational- father is ICU with Covid for 30+ days, unable to come off ventilator. The doctor's are requesting that they withdraw care. He has some anxiety. He has never had issues with this in the past. He denies SI/HI.  Flu: never Tetanus: unsure Dentist: as needed  No past medical history on file.  Current Outpatient Medications  Medication Sig Dispense Refill  . cyclobenzaprine (FLEXERIL) 10 MG tablet Take 1 tablet (10 mg total) by mouth 3 (three) times daily as needed. 15 tablet 0  . famotidine (PEPCID) 40 MG/5ML suspension Take 2.5 mLs (20 mg total) by mouth 2 (two) times daily. 75 mL 0  . ibuprofen (ADVIL) 600 MG tablet Take 1 tablet (600 mg total) by mouth every 8 (eight) hours as needed. 15 tablet 0  . ibuprofen (CHILDRENS IBUPROFEN) 100 MG/5ML suspension Take 20 mLs (400 mg total) by mouth every 4 (four) hours as needed for mild pain or moderate pain. 160 mL 0  . loratadine (CLARITIN) 10 MG tablet Take 10 mg by mouth daily.    . methylPREDNISolone (MEDROL DOSEPAK) 4 MG TBPK tablet Take Tapered dose as directed 21 tablet 0  . traMADol (ULTRAM) 50 MG tablet Take 1 tablet (50 mg total) by mouth every 6 (six) hours as needed. 20 tablet 0   No current facility-administered medications for this visit.    No Known Allergies  No family history on file.  Social History   Socioeconomic History  . Marital status: Single    Spouse name: Not on file  . Number of children: Not on file  . Years of education: Not on file  . Highest education level: Not on file  Occupational History  . Not on file  Tobacco Use  . Smoking status: Never Smoker  Substance and Sexual Activity  . Alcohol use: No  . Drug use: No  . Sexual activity: Not on file  Other Topics Concern  . Not on file  Social History Narrative  . Not on file   Social Determinants  of Health   Financial Resource Strain:   . Difficulty of Paying Living Expenses:   Food Insecurity:   . Worried About Charity fundraiser in the Last Year:   . Arboriculturist in the Last Year:   Transportation Needs:   . Film/video editor (Medical):   Marland Kitchen Lack of Transportation (Non-Medical):   Physical Activity:   . Days of Exercise per Week:   . Minutes of Exercise per Session:   Stress:   . Feeling of Stress :   Social Connections:   . Frequency of Communication with Friends and Family:   . Frequency of Social Gatherings with Friends and Family:   . Attends Religious Services:   . Active Member of Clubs or Organizations:   . Attends Archivist Meetings:   Marland Kitchen Marital Status:   Intimate Partner Violence:   . Fear of Current or Ex-Partner:   . Emotionally Abused:   Marland Kitchen Physically Abused:   . Sexually Abused:     ROS:  Constitutional: Denies fever, malaise, fatigue, headache or abrupt weight changes.  HEENT: Denies eye pain, eye redness, ear pain, ringing in the ears, wax buildup, runny nose, nasal congestion, bloody nose, or sore throat. Respiratory: Denies difficulty breathing, shortness of breath, cough or sputum production.  Cardiovascular: Denies chest pain, chest tightness, palpitations or swelling in the hands or feet.  Neurological: Denies dizziness, difficulty with memory, difficulty with speech or problems with balance and coordination.  Psych: Pt reports anxiety and depression. Denies SI/HI.  No other specific complaints in a complete review of systems (except as listed in HPI above).  PE:  BP 118/82   Pulse (!) 112   Temp 98.4 F (36.9 C) (Temporal)   Wt 201 lb (91.2 kg)   SpO2 98%   BMI 29.68 kg/m   Wt Readings from Last 3 Encounters:  06/06/16 200 lb (90.7 kg)  10/23/15 200 lb (90.7 kg)    General: Appears his stated age, well developed, well nourished in NAD. Skin: Dry and intact. Neck: Neck supple, trachea midline. No masses, lumps  or thyromegaly present.  Cardiovascular: Tachycardic with normal rhythm. S1,S2 noted.  No murmur, rubs or gallops noted.  Pulmonary/Chest: Normal effort and positive vesicular breath sounds. No respiratory distress. No wheezes, rales or ronchi noted.  Musculoskeletal:  No difficulty with gait.  Neurological: Alert and oriented.  Psychiatric: Mood and affect flat. Behavior is normal. Judgment and thought content normal.    BMET    Component Value Date/Time   NA 137 06/06/2016 1620   K 3.9 06/06/2016 1620   CL 104 06/06/2016 1620   CO2 27 06/06/2016 1620   GLUCOSE 99 06/06/2016 1620   BUN 17 06/06/2016 1620   CREATININE 0.98 06/06/2016 1620   CALCIUM 9.8 06/06/2016 1620   GFRNONAA >60 06/06/2016 1620   GFRAA >60 06/06/2016 1620    Lipid Panel  No results found for: CHOL, TRIG, HDL, CHOLHDL, VLDL, LDLCALC  CBC    Component Value Date/Time   WBC 7.7 06/06/2016 1620   RBC 5.53 06/06/2016 1620   HGB 16.5 06/06/2016 1620   HCT 46.5 06/06/2016 1620   PLT 304 06/06/2016 1620   MCV 84.0 06/06/2016 1620   MCH 29.8 06/06/2016 1620   MCHC 35.5 06/06/2016 1620   RDW 12.3 06/06/2016 1620   LYMPHSABS 1.9 10/23/2015 0816   MONOABS 0.3 10/23/2015 0816   EOSABS 0.4 10/23/2015 0816   BASOSABS 0.1 10/23/2015 0816    Hgb A1C No results found for: HGBA1C   Assessment and Plan:   Nicki Reaper, NP This visit occurred during the SARS-CoV-2 public health emergency.  Safety protocols were in place, including screening questions prior to the visit, additional usage of staff PPE, and extensive cleaning of exam room while observing appropriate contact time as indicated for disinfecting solutions.

## 2020-02-02 DIAGNOSIS — F32A Depression, unspecified: Secondary | ICD-10-CM | POA: Insufficient documentation

## 2020-02-02 DIAGNOSIS — F419 Anxiety disorder, unspecified: Secondary | ICD-10-CM | POA: Insufficient documentation

## 2020-02-02 NOTE — Assessment & Plan Note (Signed)
Situational- support offered today He declines referral for therapy RX for Prozac 10 mg daily- discussed side effects  Update me in 1 month and let me know how you are doing.

## 2020-02-02 NOTE — Patient Instructions (Signed)
Depression Screening Depression screening is a tool that your health care provider can use to learn if you have symptoms of depression. Depression is a common condition with many symptoms that are also often found in other conditions. Depression is treatable, but it must first be diagnosed. You Runyan not know that certain feelings, thoughts, and behaviors that you are having can be symptoms of depression. Taking a depression screening test can help you and your health care provider decide if you need more assessment, or if you should be referred to a mental health care provider. What are the screening tests?  You Takacs have a physical exam to see if another condition is affecting your mental health. You Farone have a blood or urine sample taken during the physical exam.  You Whyte be interviewed using a screening tool that was developed from research, such as one of these: ? Patient Health Questionnaire (PHQ). This is a set of either 2 or 9 questions. A health care provider who has been trained to score this screening test uses a guide to assess if your symptoms suggest that you Mccutchen have depression. ? Hamilton Depression Rating Scale (HAM-D). This is a set of either 17 or 24 questions. You Wasko be asked to take it again during or after your treatment, to see if your depression has gotten better. ? Beck Depression Inventory (BDI). This is a set of 21 multiple choice questions. Your health care provider scores your answers to assess:  Your level of depression, ranging from mild to severe.  Your response to treatment.  Your health care provider Soy talk with you about your daily activities, such as eating, sleeping, work, and recreation, and ask if you have had any changes in activity.  Your health care provider Waldvogel ask you to see a mental health specialist, such as a psychiatrist or psychologist, for more evaluation. Who should be screened for depression?   All adults, including adults with a family history  of a mental health disorder.  Adolescents who are 12-18 years old.  People who are recovering from a myocardial infarction (MI).  Pregnant women, or women who have given birth.  People who have a long-term (chronic) illness.  Anyone who has been diagnosed with another type of a mental health disorder.  Anyone who has symptoms that could show depression. What do my results mean? Your health care provider will review the results of your depression screening, physical exam, and lab tests. Positive screens suggest that you Bluestone have depression. Screening is the first step in getting the care that you Alberty need. It is up to you to get your screening results. Ask your health care provider, or the department that is doing your screening tests, when your results will be ready. Talk with your health care provider about your results and diagnosis. A diagnosis of depression is made using the Diagnostic and Statistical Manual of Mental Disorders (DSM-V). This is a book that lists the number and type of symptoms that must be present for a health care provider to give a specific diagnosis.  Your health care provider Herbers work with you to treat your symptoms of depression, or your health care provider Bold help you find a mental health provider who can assess, diagnose, and treat your depression. Get help right away if:  You have thoughts about hurting yourself or others. If you ever feel like you Perlman hurt yourself or others, or have thoughts about taking your own life, get help right away. You   can go to your nearest emergency department or call:  Your local emergency services (911 in the [place]  A suicide crisis helpline, such as the National Suicide Prevention Lifeline at 1-800-273-8255. This is open 24 hours a day. Summary  Depression screening is the first step in getting the help that you Ricklefs need.  If your screening test shows symptoms of depression (is positive), your health care provider Bouyer ask  you to see a mental health provider.  Anyone who is age 12 or older should be screened for depression. This information is not intended to replace advice given to you by your health care provider. Make sure you discuss any questions you have with your health care provider. Document Revised: 08/27/2017 Document Reviewed: 01/29/2017 Elsevier Patient Education  2020 Elsevier Inc.  

## 2020-02-20 ENCOUNTER — Encounter: Payer: Self-pay | Admitting: Internal Medicine

## 2020-02-20 ENCOUNTER — Telehealth (INDEPENDENT_AMBULATORY_CARE_PROVIDER_SITE_OTHER): Payer: 59 | Admitting: Internal Medicine

## 2020-02-20 DIAGNOSIS — F419 Anxiety disorder, unspecified: Secondary | ICD-10-CM

## 2020-02-20 DIAGNOSIS — F32A Depression, unspecified: Secondary | ICD-10-CM

## 2020-02-20 DIAGNOSIS — F329 Major depressive disorder, single episode, unspecified: Secondary | ICD-10-CM

## 2020-02-20 NOTE — Progress Notes (Signed)
Virtual Visit via Video Note  I connected with Angel Montgomery on 02/20/20 at 12:15 PM EDT by a video enabled telemedicine application and verified that I am speaking with the correct person using two identifiers.  Location: Patient: Home Provider: Office   I discussed the limitations of evaluation and management by telemedicine and the availability of in person appointments. The patient expressed understanding and agreed to proceed.  History of Present Illness:  Pt wanting to discuss leave of abscence. He has been out of work due to adjustment issues dealing with his dad who has been in ICU with Covid. He is finally doing better and will move to a stepdown bed when one in available. He has not noticed any major differences since starting the Fluoxetine but has not had any major side effects. He plans to return to work on June 1st.   No past medical history on file.  Current Outpatient Medications  Medication Sig Dispense Refill  . FLUoxetine (PROZAC) 20 MG/5ML solution Take 2.5 mLs (10 mg total) by mouth daily. 80 mL 2  . loratadine (CLARITIN) 10 MG tablet Take 10 mg by mouth daily.     No current facility-administered medications for this visit.    No Known Allergies  No family history on file.  Social History   Socioeconomic History  . Marital status: Single    Spouse name: Not on file  . Number of children: Not on file  . Years of education: Not on file  . Highest education level: Not on file  Occupational History  . Not on file  Tobacco Use  . Smoking status: Never Smoker  Substance and Sexual Activity  . Alcohol use: No  . Drug use: No  . Sexual activity: Not on file  Other Topics Concern  . Not on file  Social History Narrative  . Not on file   Social Determinants of Health   Financial Resource Strain:   . Difficulty of Paying Living Expenses:   Food Insecurity:   . Worried About Programme researcher, broadcasting/film/video in the Last Year:   . Barista in the Last Year:    Transportation Needs:   . Freight forwarder (Medical):   Marland Kitchen Lack of Transportation (Non-Medical):   Physical Activity:   . Days of Exercise per Week:   . Minutes of Exercise per Session:   Stress:   . Feeling of Stress :   Social Connections:   . Frequency of Communication with Friends and Family:   . Frequency of Social Gatherings with Friends and Family:   . Attends Religious Services:   . Active Member of Clubs or Organizations:   . Attends Banker Meetings:   Marland Kitchen Marital Status:   Intimate Partner Violence:   . Fear of Current or Ex-Partner:   . Emotionally Abused:   Marland Kitchen Physically Abused:   . Sexually Abused:      Constitutional: Denies fever, malaise, fatigue, headache or abrupt weight changes.  Respiratory: Denies difficulty breathing, shortness of breath, cough or sputum production.   Cardiovascular: Denies chest pain, chest tightness, palpitations or swelling in the hands or feet.  Neurological: Denies dizziness, difficulty with memory, difficulty with speech or problems with balance and coordination.  Psych: Pt reports anxiety and depression. Denies SI/HI.  No other specific complaints in a complete review of systems (except as listed in HPI above).    Observations/Objective:   Wt Readings from Last 3 Encounters:  02/01/20 201 lb (91.2  kg)  06/06/16 200 lb (90.7 kg)  10/23/15 200 lb (90.7 kg)    General: Appears his stated age, obese, in NAD. Pulmonary/Chest: Normal effort. No respiratory distress.  Neurological: Alert and oriented.  Psychiatric: Mood and affect normal. Behavior is normal. Judgment and thought content normal.     BMET    Component Value Date/Time   NA 137 06/06/2016 1620   K 3.9 06/06/2016 1620   CL 104 06/06/2016 1620   CO2 27 06/06/2016 1620   GLUCOSE 99 06/06/2016 1620   BUN 17 06/06/2016 1620   CREATININE 0.98 06/06/2016 1620   CALCIUM 9.8 06/06/2016 1620   GFRNONAA >60 06/06/2016 1620   GFRAA >60 06/06/2016  1620    Lipid Panel  No results found for: CHOL, TRIG, HDL, CHOLHDL, VLDL, LDLCALC  CBC    Component Value Date/Time   WBC 7.7 06/06/2016 1620   RBC 5.53 06/06/2016 1620   HGB 16.5 06/06/2016 1620   HCT 46.5 06/06/2016 1620   PLT 304 06/06/2016 1620   MCV 84.0 06/06/2016 1620   MCH 29.8 06/06/2016 1620   MCHC 35.5 06/06/2016 1620   RDW 12.3 06/06/2016 1620   LYMPHSABS 1.9 10/23/2015 0816   MONOABS 0.3 10/23/2015 0816   EOSABS 0.4 10/23/2015 0816   BASOSABS 0.1 10/23/2015 0816    Hgb A1C No results found for: HGBA1C     Assessment and Plan:  Anxiety and Depression:  Continue Fluoxetine Support offered today He has a note that releases him to work on June 1 No further intervention needed at this time  Follow Up Instructions:    I discussed the assessment and treatment plan with the patient. The patient was provided an opportunity to ask questions and all were answered. The patient agreed with the plan and demonstrated an understanding of the instructions.   The patient was advised to call back or seek an in-person evaluation if the symptoms worsen or if the condition fails to improve as anticipated.     Webb Silversmith, NP

## 2020-02-20 NOTE — Patient Instructions (Signed)
Major Depressive Disorder, Adult Major depressive disorder (MDD) is a mental health condition. MDD often makes you feel sad, hopeless, or helpless. MDD can also cause symptoms in your body. MDD can affect your:  Work.  School.  Relationships.  Other normal activities. MDD can range from mild to very bad. It Labombard occur once (single episode MDD). It can also occur many times (recurrent MDD). The main symptoms of MDD often include:  Feeling sad, depressed, or irritable most of the time.  Loss of interest. MDD symptoms also include:  Sleeping too much or too little.  Eating too much or too little.  A change in your weight.  Feeling tired (fatigue) or having low energy.  Feeling worthless.  Feeling guilty.  Trouble making decisions.  Trouble thinking clearly.  Thoughts of suicide or harming others.  Feeling weak.  Feeling agitated.  Keeping yourself from being around other people (isolation). Follow these instructions at home: Activity  Do these things as told by your doctor: ? Go back to your normal activities. ? Exercise regularly. ? Spend time outdoors. Alcohol  Talk with your doctor about how alcohol can affect your antidepressant medicines.  Do not drink alcohol. Or, limit how much alcohol you drink. ? This means no more than 1 drink a day for nonpregnant women and 2 drinks a day for men. One drink equals one of these:  12 oz of beer.  5 oz of wine.  1 oz of hard liquor. General instructions  Take over-the-counter and prescription medicines only as told by your doctor.  Eat a healthy diet.  Get plenty of sleep.  Find activities that you enjoy. Make time to do them.  Think about joining a support group. Your doctor Morelos be able to suggest a group for you.  Keep all follow-up visits as told by your doctor. This is important. Where to find more information:  National Alliance on Mental Illness: ? www.nami.org  U.S. National Institute of Mental  Health: ? www.nimh.nih.gov  National Suicide Prevention Lifeline: ? 1-800-273-8255. This is free, 24-hour help. Contact a doctor if:  Your symptoms get worse.  You have new symptoms. Get help right away if:  You self-harm.  You see, hear, taste, smell, or feel things that are not present (hallucinate). If you ever feel like you Asato hurt yourself or others, or have thoughts about taking your own life, get help right away. You can go to your nearest emergency department or call:  Your local emergency services (911 in the [place]  A suicide crisis helpline, such as the National Suicide Prevention Lifeline: ? 1-800-273-8255. This is open 24 hours a day. This information is not intended to replace advice given to you by your health care provider. Make sure you discuss any questions you have with your health care provider. Document Revised: 08/27/2017 Document Reviewed: 05/31/2016 Elsevier Patient Education  2020 Elsevier Inc.  

## 2020-03-01 ENCOUNTER — Telehealth: Payer: Self-pay

## 2020-03-01 NOTE — Telephone Encounter (Signed)
FMLA ppw done and faxed to [facility], copy sent to be scanned

## 2020-03-11 ENCOUNTER — Telehealth: Payer: Self-pay | Admitting: *Deleted

## 2020-03-11 NOTE — Telephone Encounter (Signed)
Patient's mom left a voicemail wanting to know if you have gotten more paperwork from [place] that needs to be completed by Nicki Reaper NP. Mrs. Spieth stated this paperwork is needed for him being out of work because of his nerves and his dad and grandfather passing away.

## 2020-03-11 NOTE — Telephone Encounter (Signed)
I have not seen any additional paperwork, and if he needs additional time off, he needs to schedule a virtual appt with me to discuss.

## 2020-03-12 NOTE — Telephone Encounter (Signed)
appt scheduled

## 2020-03-13 ENCOUNTER — Telehealth (INDEPENDENT_AMBULATORY_CARE_PROVIDER_SITE_OTHER): Payer: 59 | Admitting: Internal Medicine

## 2020-03-13 ENCOUNTER — Encounter: Payer: Self-pay | Admitting: Internal Medicine

## 2020-03-13 DIAGNOSIS — F329 Major depressive disorder, single episode, unspecified: Secondary | ICD-10-CM | POA: Diagnosis not present

## 2020-03-13 DIAGNOSIS — F419 Anxiety disorder, unspecified: Secondary | ICD-10-CM

## 2020-03-13 DIAGNOSIS — F4321 Adjustment disorder with depressed mood: Secondary | ICD-10-CM

## 2020-03-13 NOTE — Progress Notes (Signed)
Virtual Visit via Video Note  I connected with Angel Montgomery on 03/13/20 at  4:15 PM EDT by a video enabled telemedicine application and verified that I am speaking with the correct person using two identifiers.  Location: Patient: Home  Provider: Office   I discussed the limitations of evaluation and management by telemedicine and the availability of in person appointments. The patient expressed understanding and agreed to proceed.  History of Present Illness:  Pt wanting to follow up anxiety and depression. He reports this has all been triggered by the recent death of his father, who had been in the hospital for 7-8 weeks prior fighting Covid. He is taking Fluoxetine as prescribed and has noticed some improvment. He is not currently seeing a therapist.    No past medical history on file.  Current Outpatient Medications  Medication Sig Dispense Refill  . FLUoxetine (PROZAC) 20 MG/5ML solution Take 2.5 mLs (10 mg total) by mouth daily. 80 mL 2  . loratadine (CLARITIN) 10 MG tablet Take 10 mg by mouth daily.     No current facility-administered medications for this visit.    No Known Allergies  No family history on file.  Social History   Socioeconomic History  . Marital status: Single    Spouse name: Not on file  . Number of children: Not on file  . Years of education: Not on file  . Highest education level: Not on file  Occupational History  . Not on file  Tobacco Use  . Smoking status: Never Smoker  Substance and Sexual Activity  . Alcohol use: No  . Drug use: No  . Sexual activity: Not on file  Other Topics Concern  . Not on file  Social History Narrative  . Not on file   Social Determinants of Health   Financial Resource Strain:   . Difficulty of Paying Living Expenses:   Food Insecurity:   . Worried About Charity fundraiser in the Last Year:   . Arboriculturist in the Last Year:   Transportation Needs:   . Film/video editor (Medical):   Marland Kitchen Lack of  Transportation (Non-Medical):   Physical Activity:   . Days of Exercise per Week:   . Minutes of Exercise per Session:   Stress:   . Feeling of Stress :   Social Connections:   . Frequency of Communication with Friends and Family:   . Frequency of Social Gatherings with Friends and Family:   . Attends Religious Services:   . Active Member of Clubs or Organizations:   . Attends Archivist Meetings:   Marland Kitchen Marital Status:   Intimate Partner Violence:   . Fear of Current or Ex-Partner:   . Emotionally Abused:   Marland Kitchen Physically Abused:   . Sexually Abused:      Constitutional: Denies fever, malaise, fatigue, headache or abrupt weight changes.  Respiratory: Denies difficulty breathing, shortness of breath, cough or sputum production.   Cardiovascular: Denies chest pain, chest tightness, palpitations or swelling in the hands or feet.  Neurological: Denies dizziness, difficulty with memory, difficulty with speech or problems with balance and coordination.  Psych: Pt reports anxiety and depression. Denies SI/HI.  No other specific complaints in a complete review of systems (except as listed in HPI above).  Observations/Objective:  Wt Readings from Last 3 Encounters:  02/01/20 201 lb (91.2 kg)  06/06/16 200 lb (90.7 kg)  10/23/15 200 lb (90.7 kg)    General: Appears his stated  age, obese, in NAD. Pulmonary/Chest: Normal effort. Neurological: Alert and oriented. Psychiatric: Mood and affect mildly flat.  BMET    Component Value Date/Time   NA 137 06/06/2016 1620   K 3.9 06/06/2016 1620   CL 104 06/06/2016 1620   CO2 27 06/06/2016 1620   GLUCOSE 99 06/06/2016 1620   BUN 17 06/06/2016 1620   CREATININE 0.98 06/06/2016 1620   CALCIUM 9.8 06/06/2016 1620   GFRNONAA >60 06/06/2016 1620   GFRAA >60 06/06/2016 1620    Lipid Panel  No results found for: CHOL, TRIG, HDL, CHOLHDL, VLDL, LDLCALC  CBC    Component Value Date/Time   WBC 7.7 06/06/2016 1620   RBC 5.53  06/06/2016 1620   HGB 16.5 06/06/2016 1620   HCT 46.5 06/06/2016 1620   PLT 304 06/06/2016 1620   MCV 84.0 06/06/2016 1620   MCH 29.8 06/06/2016 1620   MCHC 35.5 06/06/2016 1620   RDW 12.3 06/06/2016 1620   LYMPHSABS 1.9 10/23/2015 0816   MONOABS 0.3 10/23/2015 0816   EOSABS 0.4 10/23/2015 0816   BASOSABS 0.1 10/23/2015 0816    Hgb A1C No results found for: HGBA1C     Assessment and Plan:   Follow Up Instructions:    I discussed the assessment and treatment plan with the patient. The patient was provided an opportunity to ask questions and all were answered. The patient agreed with the plan and demonstrated an understanding of the instructions.   The patient was advised to call back or seek an in-person evaluation if the symptoms worsen or if the condition fails to improve as anticipated.    Nicki Reaper, NP

## 2020-03-13 NOTE — Assessment & Plan Note (Signed)
Persistent due to death of his father Support offered today Continue Fluoxetine- he does not feel like he needs a dose adjustment Will fill out FMLA to return to work 04/01/20

## 2020-03-13 NOTE — Patient Instructions (Signed)
Complicated Grief Grief is a normal response to the death of someone close to you. Feelings of fear, anger, and guilt can affect almost everyone who loses a loved one. It is also common to have symptoms of depression while you are grieving. These include problems with sleep, loss of appetite, and lack of energy. They Coca last for weeks or months after a loss. Complicated grief is different from normal grief or depression. Normal grieving involves sadness and feelings of loss, but those feelings get better and heal over time. Complicated grief is a severe type of grief that lasts for a long time, usually for several months to a year or longer. It interferes with your ability to function normally. Complicated grief Deleo require treatment from a mental health care provider. What are the causes? The cause of this condition is not known. It is not clear why some people continue to struggle with grief and others do not. What increases the risk? You are more likely to develop this condition if:  The death of your loved one was sudden or unexpected.  The death of your loved one was due to a violent event.  Your loved one died from suicide.  Your loved one was a child or a young person.  You were very close to your loved one, or you were dependent on him or her.  You have a history of depression or anxiety. What are the signs or symptoms? Symptoms of this condition include:  Feeling disbelief or having a lack of emotion (numbness).  Being unable to enjoy good memories of your loved one.  Needing to avoid anything or anyone that reminds you of your loved one.  Being unable to stop thinking about the death.  Feeling intense anger or guilt.  Feeling alone and hopeless.  Feeling that your life is meaningless and empty.  Losing the desire to move on with your life. How is this diagnosed? This condition Rittenberry be diagnosed based on:  Your symptoms. Complicated grief will be diagnosed if you have  ongoing symptoms of grief for 6-12 months or longer.  The effect of symptoms on your life. You Timoney be diagnosed with this condition if your symptoms are interfering with your ability to live your life. Your health care provider Loseke recommend that you see a mental health care provider. Many symptoms of depression are similar to the symptoms of complicated grief. It is important to be evaluated for complicated grief along with other mental health conditions. How is this treated? This condition is most commonly treated with talk therapy. This therapy is offered by a mental health specialist (psychiatrist). During therapy:  You will learn healthy ways to cope with the loss of your loved one.  Your mental health care provider Katayama recommend antidepressant medicines. Follow these instructions at home: Lifestyle   Take care of yourself. ? Eat on a regular basis, and maintain a healthy diet. Eat plenty of fruits, vegetables, lean protein, and whole grains. ? Try to get some exercise each day. Aim for 30 minutes of exercise on most days of the week. ? Keep a consistent sleep schedule. Try to get 8 or more hours of sleep each night. ? Start doing the things that you used to enjoy.  Do not use drugs or alcohol to ease your symptoms.  Spend time with friends and loved ones. General instructions  Take over-the-counter and prescription medicines only as told by your health care provider.  Consider joining a grief (bereavement) support group   to help you deal with your loss.  Keep all follow-up visits as told by your health care provider. This is important. Contact a health care provider if:  Your symptoms prevent you from functioning normally.  Your symptoms do not get better with treatment. Get help right away if:  You have serious thoughts about hurting yourself or someone else.  You have suicidal feelings. If you ever feel like you Horseman hurt yourself or others, or have thoughts about taking  your own life, get help right away. You can go to your nearest emergency department or call:  Your local emergency services (911 in the [place]  A suicide crisis helpline, such as the National Suicide Prevention Lifeline at 1-800-273-8255. This is open 24 hours a day. Summary  Complicated grief is a severe type of grief that lasts for a long time. This grief is not likely to go away on its own. Get the help you need.  Some griefs are more difficult than others and can cause this condition. You Macht need a certain type of treatment to help you recover if the loss of your loved one was sudden, violent, or due to suicide.  You Fetty feel guilty about moving on with your life. Getting help does not mean that you are forgetting your loved one. It means that you are taking care of yourself.  Complicated grief is best treated with talk therapy. Medicines Vales also be prescribed.  Seek the help you need, and find support that will help you recover. This information is not intended to replace advice given to you by your health care provider. Make sure you discuss any questions you have with your health care provider. Document Revised: 08/27/2017 Document Reviewed: 06/30/2017 Elsevier Patient Education  2020 Elsevier Inc.  

## 2020-03-20 ENCOUNTER — Telehealth: Payer: Self-pay

## 2020-03-20 NOTE — Telephone Encounter (Signed)
The dates on ppw were 02/01/2020-02/27/2020, it is scanned into chart

## 2020-03-20 NOTE — Telephone Encounter (Signed)
[facility] Night - Client Nonclinical Telephone Record AccessNurse Client [facility] Night - Client Client Site [facility] - Night Physician Nicki Reaper - NP Contact Type Call Who Is Calling Patient / Member / Family / Caregiver Caller Name Angel Montgomery Phone Number [phone] Patient Name Angel Montgomery Patient DOB 1991/06/21 Call Type Message Only Information Provided Reason for Call Request for General Office Information Initial Comment Her son was removed from work and they are needing paperwork for the dates 04/27 through 05/05 Additional Comment Disp. Time Disposition Final User 03/19/2020 4:59:34 PM General Information Provided Yes Wisdom, Zach Call Closed By: Ian Bushman Transaction Date/Time: 03/19/2020 4:57:27 PM (ET)

## 2020-03-20 NOTE — Telephone Encounter (Signed)
Was this not faxed?

## 2020-03-21 NOTE — Telephone Encounter (Signed)
Do we need to re-fax?

## 2020-03-21 NOTE — Telephone Encounter (Signed)
Left message on voicemail.

## 2020-04-15 ENCOUNTER — Other Ambulatory Visit: Payer: Self-pay | Admitting: Internal Medicine

## 2020-04-15 NOTE — Telephone Encounter (Signed)
Patient's mother called. She wants to know if patient is suppose to continue on the Fluoxetine.

## 2020-04-16 NOTE — Telephone Encounter (Signed)
2.5 mls every other day x 1 week, then every every 3 days x 1 week then stop

## 2020-04-16 NOTE — Telephone Encounter (Signed)
Patient's mother called office. She needs to know if son is suppose to continue taking Fluoxetine.

## 2020-04-16 NOTE — Telephone Encounter (Signed)
Patient's mother stated the pharmacy informed her that this isnt a mediation that you can just stop taking, so she wants to know asap if he still needs it and if so to get Fluoxetine refilled asap (she wants to have it today by 6). Please call patient's mother back asap [phone].

## 2020-04-16 NOTE — Telephone Encounter (Signed)
Pt wants to stop Prozac, what are the instructions for doing that?

## 2020-04-17 NOTE — Telephone Encounter (Signed)
msg sent to mother with instructions

## 2020-06-11 ENCOUNTER — Encounter: Payer: Self-pay | Admitting: Family Medicine

## 2020-06-11 ENCOUNTER — Other Ambulatory Visit: Payer: Self-pay

## 2020-06-11 ENCOUNTER — Ambulatory Visit (INDEPENDENT_AMBULATORY_CARE_PROVIDER_SITE_OTHER): Payer: 59 | Admitting: Family Medicine

## 2020-06-11 DIAGNOSIS — M5416 Radiculopathy, lumbar region: Secondary | ICD-10-CM | POA: Insufficient documentation

## 2020-06-11 MED ORDER — METHOCARBAMOL 500 MG PO TABS: 500.0000 mg | ORAL_TABLET | Freq: Three times a day (TID) | ORAL | 0 refills | Status: DC | PRN

## 2020-06-11 MED ORDER — PREDNISONE 20 MG PO TABS
ORAL_TABLET | ORAL | 0 refills | Status: DC
Start: 2020-06-11 — End: 2022-11-06

## 2020-06-11 NOTE — Patient Instructions (Addendum)
Suspicious for recurrent pinched nerve from herniated disc, hopefully will improve over time.  Treat with prednisone taper, continue tylenol, add muscle relaxant. Yeargan also continue heating pad or ice.  Out of work for the next week then keep [place] updated on how you're doing to consider return to work light duty.  Let us know right away if any leg weakness, loss of control of bowel/bladder, groin numbness, or other worsening symptoms.   Lumbosacral Radiculopathy Lumbosacral radiculopathy is a condition that involves the spinal nerves and nerve roots in the low back and bottom of the spine. The condition develops when these nerves and nerve roots move out of place or become inflamed and cause symptoms. What are the causes? This condition Minckler be caused by:  Pressure from a disk that bulges out of place (herniated disk). A disk is a plate of soft cartilage that separates bones in the spine.  Disk changes that occur with age (disk degeneration).  A narrowing of the bones of the lower back (spinal stenosis).  A tumor.  An infection.  An injury that places sudden pressure on the disks that cushion the bones of your lower spine. What increases the risk? You are more likely to develop this condition if:  You are a male who is 44-24 years old.  You are a male who is 66-79 years old.  You use improper technique when lifting things.  You are overweight or live a sedentary lifestyle.  Your work requires frequent lifting.  You smoke.  You do repetitive activities that strain the spine. What are the signs or symptoms? Symptoms of this condition include:  Pain that goes down from your back into your legs (sciatica), usually on one side of the body. This is the most common symptom. The pain Knauff be worse with sitting, coughing, or sneezing.  Pain and numbness in your legs.  Muscle weakness.  Tingling.  Loss of bladder control or bowel control. How is this diagnosed? This condition Mally  be diagnosed based on:  Your symptoms and medical history.  A physical exam. If the pain is lasting, you Bourbon have tests, such as:  MRI scan.  X-ray.  CT scan.  A type of X-ray used to examine the spinal canal after injecting a dye into your spine (myelogram).  A test to measure how electrical impulses move through a nerve (nerve conduction study). How is this treated? Treatment Leu depend on the cause of the condition and Ramson include:  Working with a physical therapist.  Taking pain medicine.  Applying heat and ice to affected areas.  Doing stretches to improve flexibility.  Doing exercises to strengthen back muscles.  Having chiropractic spinal manipulation.  Using transcutaneous electrical nerve stimulation (TENS) therapy.  Getting a steroid injection in the spine. In some cases, no treatment is needed. If the condition is long-lasting (chronic), or if symptoms are severe, treatment Amberg involve surgery or lifestyle changes, such as following a weight-loss plan. Follow these instructions at home: Activity  Avoid bending and other activities that make the problem worse.  Maintain a proper position when standing or sitting: ? When standing, keep your upper back and neck straight, with your shoulders pulled back. Avoid slouching. ? When sitting, keep your back straight and relax your shoulders. Do not round your shoulders or pull them backward.  Do not sit or stand in one place for long periods of time.  Take brief periods of rest throughout the day. This will reduce your pain. It  is usually better to rest by lying down or standing, not sitting.  When you are resting for longer periods, mix in some mild activity or stretching between periods of rest. This will help to prevent stiffness and pain.  Get regular exercise. Ask your health care provider what activities are safe for you. If you were shown how to do any exercises or stretches, do them as directed by your  health care provider.  Do not lift anything that is heavier than 10 lb (4.5 kg) or the limit that you are told by your health care provider. Always use proper lifting technique, which includes: ? Bending your knees. ? Keeping the load close to your body. ? Avoiding twisting. Managing pain  If directed, put ice on the affected area: ? Put ice in a plastic bag. ? Place a towel between your skin and the bag. ? Leave the ice on for 20 minutes, 2-3 times a day.  If directed, apply heat to the affected area as often as told by your health care provider. Use the heat source that your health care provider recommends, such as a moist heat pack or a heating pad. ? Place a towel between your skin and the heat source. ? Leave the heat on for 20-30 minutes. ? Remove the heat if your skin turns bright red. This is especially important if you are unable to feel pain, heat, or cold. You Siddique have a greater risk of getting burned.  Take over-the-counter and prescription medicines only as told by your health care provider. General instructions  Sleep on a firm mattress in a comfortable position. Try lying on your side with your knees slightly bent. If you lie on your back, put a pillow under your knees.  Do not drive or use heavy machinery while taking prescription pain medicine.  If your health care provider prescribed a diet or exercise program, follow it as directed.  Keep all follow-up visits as told by your health care provider. This is important. Contact a health care provider if:  Your pain does not improve over time, even when taking pain medicines. Get help right away if:  You develop severe pain.  Your pain suddenly gets worse.  You develop increasing weakness in your legs.  You lose the ability to control your bladder or bowel.  You have difficulty walking or balancing.  You have a fever. Summary  Lumbosacral radiculopathy is a condition that occurs when the spinal nerves and  nerve roots in the lower part of the spine move out of place or become inflamed and cause symptoms.  Symptoms include pain, numbness, and tingling that go down from your back into your legs (sciatica), muscle weakness, and loss of bladder control or bowel control.  If directed, apply ice or heat to the affected area as told by your health care provider.  Follow instructions about activity, rest, and proper lifting technique. This information is not intended to replace advice given to you by your health care provider. Make sure you discuss any questions you have with your health care provider. Document Revised: 09/02/2017 Document Reviewed: 09/02/2017 Elsevier Patient Education  2020 [employer].

## 2020-06-11 NOTE — Progress Notes (Addendum)
This visit was conducted in person.  BP 118/84 (BP Location: Right Arm, Patient Position: Sitting, Cuff Size: Large)   Pulse 99   Temp 98.2 F (36.8 C) (Temporal)   Wt 206 lb 5 oz (93.6 kg)   SpO2 99%   BMI 30.47 kg/m    CC: L lower back pain  Subjective:    Patient ID: Angel Montgomery, male    DOB: Jan 21, 1991, 29 y.o.   MRN: [id number]  HPI: Angel Montgomery is a 29 y.o. male presenting on 06/11/2020 for Back Pain (C/o left low back pain, radiating through hip down to knee.  Started about 2 days ago.  Tried Tylenol, barely helpful. )   2d h/o lower left lower back pain with radiation down posterior buttock into knee. Denies inciting trauma/injury or falls. Started while at home. No fevers/chills, numbness or weakness down leg, saddle anesthesia, bowel/bladder accidents. Some paresthesias to left toes started this morning.   Treating with tylenol with mild improvement.  H/o back surgery 2011 for herniated disc with pinched nerve (Dr Newell Coral).   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      Relevant past medical, surgical, family and social history reviewed and updated as indicated. Interim medical history since our last visit reviewed. Allergies and medications reviewed and updated. Outpatient Medications Prior to Visit  Medication Sig Dispense Refill  . FLUoxetine (PROZAC) 20 MG/5ML solution GIVE 2.5MLS BY MOUTH DAILY 80 mL 0  . loratadine (CLARITIN) 10 MG tablet Take 10 mg by mouth daily.     No facility-administered medications prior to visit.     Per HPI unless specifically indicated in ROS section below Review of Systems Objective:  BP 118/84 (BP Location: Right Arm, Patient Position: Sitting, Cuff Size: Large)   Pulse 99   Temp 98.2 F (36.8 C) (Temporal)   Wt 206 lb 5 oz (93.6 kg)   SpO2 99%   BMI 30.47 kg/m   Wt Readings from Last 3 Encounters:  06/11/20 206 lb 5 oz (93.6 kg)  02/01/20 201 lb (91.2 kg)  06/06/16 200 lb (90.7 kg)        Physical Exam Vitals and nursing note reviewed.  Constitutional:      Appearance: Normal appearance. He is not ill-appearing.  Musculoskeletal:        General: Normal range of motion.     Right lower leg: No edema.     Left lower leg: No edema.     Comments:  Discomfort diffusely along midline lumbar spine Mild L lumbar paraspinous mm tenderness + SLR on left No pain with int/ext rotation at hip. Mild discomfort at L SIJ and sciatic notch. No pain at GTB bilaterally.   Skin:    General: Skin is warm and dry.     Findings: No rash.  Neurological:     General: No focal deficit present.     Mental Status: He is alert.     Sensory: Sensation is intact.     Motor: Motor function is intact.     Coordination: Coordination is intact.     Deep Tendon Reflexes:     Reflex Scores:      Patellar reflexes are 2+ on the right side and 2+ on the left side.    Comments:  Antalgic gait Able to heel and toe walk albeit with discomfort  Psychiatric:        Mood and Affect: Mood normal.  Behavior: Behavior normal.       Assessment & Plan:  This visit occurred during the SARS-CoV-2 public health emergency.  Safety protocols were in place, including screening questions prior to the visit, additional usage of staff PPE, and extensive cleaning of exam room while observing appropriate contact time as indicated for disinfecting solutions.   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 No red flags today however. Treat conservatively with prednisone (with steroid precautions), muscle relaxant (sedation precautions), tylenol, heating pad, rest. Out of work x 1 wk, with tentative return to work in 1 wk likely with light duty restrictions. Reviewed red flags to indicate urgent need for MRI. Update with treatment effect      Relevant Medications    methocarbamol (ROBAXIN) 500 MG tablet       Meds ordered this encounter  Medications  . predniSONE (DELTASONE) 20 MG tablet    Sig: Take two tablets daily for 4 days followed by one tablet daily for 4 days    Dispense:  12 tablet    Refill:  0  . methocarbamol (ROBAXIN) 500 MG tablet    Sig: Take 1 tablet (500 mg total) by mouth every 8 (eight) hours as needed for muscle spasms (sedation precautions).    Dispense:  30 tablet    Refill:  0   No orders of the defined types were placed in this encounter.   Patient instructions: Suspicious for recurrent pinched nerve from herniated disc, hopefully will improve over time.  Treat with prednisone taper, continue tylenol, add muscle relaxant. Salih also continue heating pad or ice.  Out of work for the next week then keep [place] updated on how you're doing to consider return to work light duty.  Let us know right away if any leg weakness, loss of control of bowel/bladder, groin numbness, or other worsening symptoms.   Follow up plan: Return if symptoms worsen or fail to improve.  Eustaquio Boyden, MD

## 2020-06-11 NOTE — Assessment & Plan Note (Addendum)
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 No red flags today however. Treat conservatively with prednisone (with steroid precautions), muscle relaxant (sedation precautions), tylenol, heating pad, rest. Out of work x 1 wk, with tentative return to work in 1 wk likely with light duty restrictions. Reviewed red flags to indicate urgent need for MRI. Update with treatment effect

## 2020-06-17 ENCOUNTER — Telehealth: Payer: Self-pay | Admitting: Internal Medicine

## 2020-06-17 MED ORDER — TRAMADOL HCL 50 MG PO TABS: 50.0000 mg | ORAL_TABLET | Freq: Three times a day (TID) | ORAL | 0 refills | Status: AC | PRN

## 2020-06-17 NOTE — Telephone Encounter (Signed)
Pt's mother called and says the meds you gave for DOS 06/11/2020 have not helped at all and he still can barely walk.  Please advise. Pt requests c/b  [phone]  Thank you!

## 2020-06-17 NOTE — Telephone Encounter (Addendum)
Any new numbness or weakness of left leg or foot? Or new bowel/bladder incontinence? Perrier return to ibuprofen 400-600mg  TID with meals for pain.  Villanueva add tramadol breakthrough pain - caution with sedation on this medicine.  rec OV next Monday with myself or PCP if not improving with above. Let us know sooner if worsening despite treatment.  rec stay out of work for this week, [place] write letter to that effect.   [place] CSRS reviewed.

## 2020-06-18 ENCOUNTER — Telehealth: Payer: Self-pay | Admitting: Internal Medicine

## 2020-06-18 NOTE — Telephone Encounter (Addendum)
Spoke with pt's mom, Sybil (no dpr), informing her I could not discuss pt's info with her.  Asked her to have pt give [place] a call back.  Need to relay Dr. Timoteo Expose message.   Printed work note and placed at front office- yellow folders.

## 2020-06-18 NOTE — Telephone Encounter (Signed)
fmla paperwork in dr g in box  °For review and signature °

## 2020-06-19 NOTE — Telephone Encounter (Signed)
Lvm asking pt to call back.  Need to relay Dr. Timoteo Expose message and let pt know a work note is available to pick up at the front office.

## 2020-06-21 ENCOUNTER — Telehealth: Payer: Self-pay

## 2020-06-21 NOTE — Telephone Encounter (Signed)
Spoke with pt/pt's mom, Angel Montgomery, relaying Dr. Timoteo Expose message.  They verbalize understanding and will pick up work note today.

## 2020-06-21 NOTE — Telephone Encounter (Signed)
Patient is still not feeling better. Mom says no improvement has occurred and Mr. Angel Montgomery is still unable to work. Please have the nurse call the patient.

## 2020-06-23 NOTE — Telephone Encounter (Signed)
He needs to be seen in the office.

## 2020-06-24 NOTE — Telephone Encounter (Addendum)
Filled and in my out box.  If not improved, recommend return to office with myself or PCP for re examination and discuss next steps.

## 2020-06-25 NOTE — Telephone Encounter (Signed)
Paperwork faxed °

## 2020-06-26 NOTE — Telephone Encounter (Signed)
Left message asking pt to call office  Please let pt know paperwork has been faxed and a copy here for him  Copy for pt Copy for scan

## 2020-06-27 NOTE — Telephone Encounter (Signed)
Please schedule pt

## 2020-07-01 NOTE — Telephone Encounter (Signed)
Patients mother states that he was seen at a ortho urgent care and they have ordered mri. Did not want to be seen here.

## 2020-07-22 ENCOUNTER — Other Ambulatory Visit: Payer: Self-pay | Admitting: Internal Medicine

## 2020-12-17 IMAGING — CR DG CERVICAL SPINE COMPLETE 4+V
1 series · 6 of 6 positions shown · non-contrast
Comparison: None.

CLINICAL DATA: 28-year-old male with a history of neck pain

EXAM:
CERVICAL SPINE - COMPLETE 4+ VIEW

[Series 1: dg cervical spine complete · 0.14mm/px · 6 of 6 slices shown]
[im 1/6]
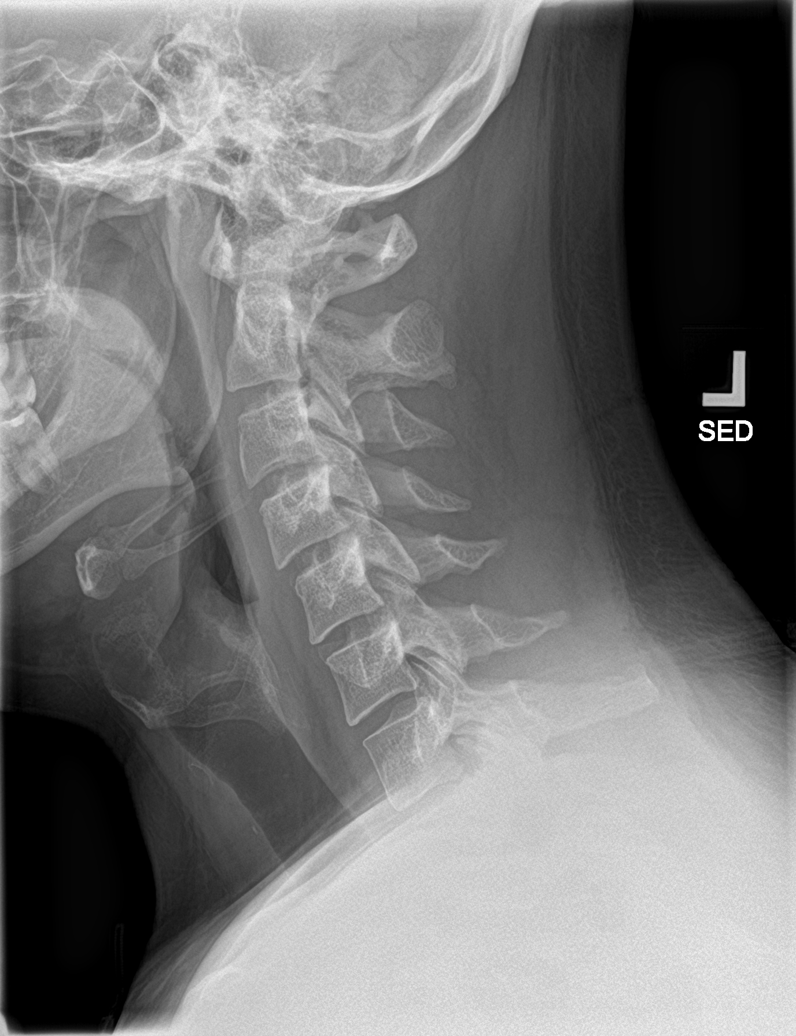
[im 2/6]
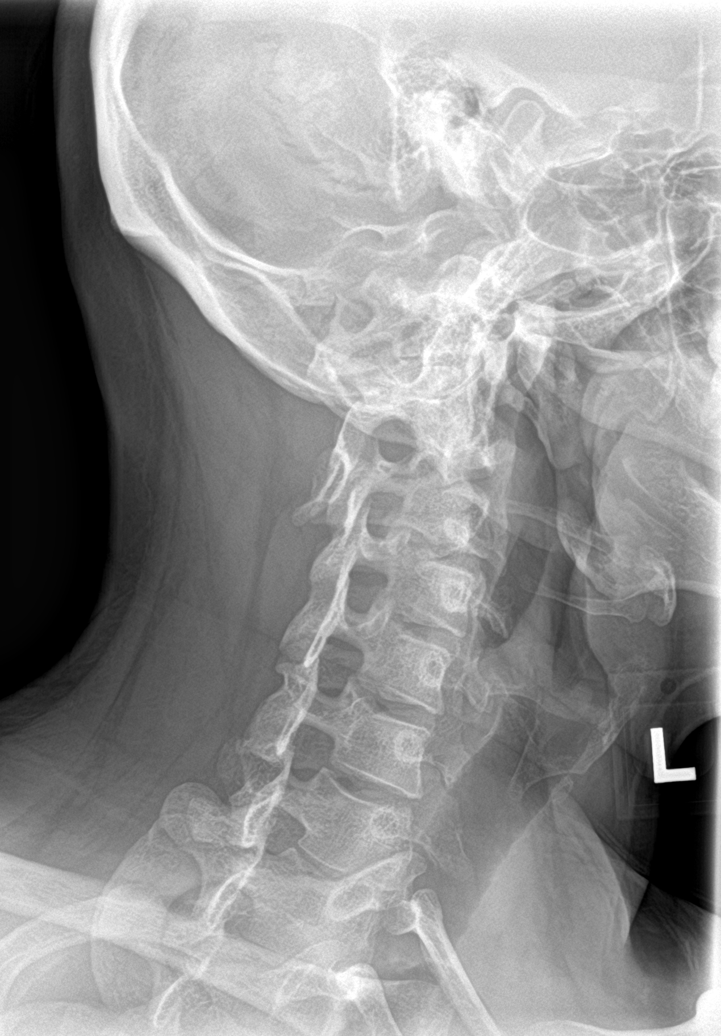
[im 3/6]
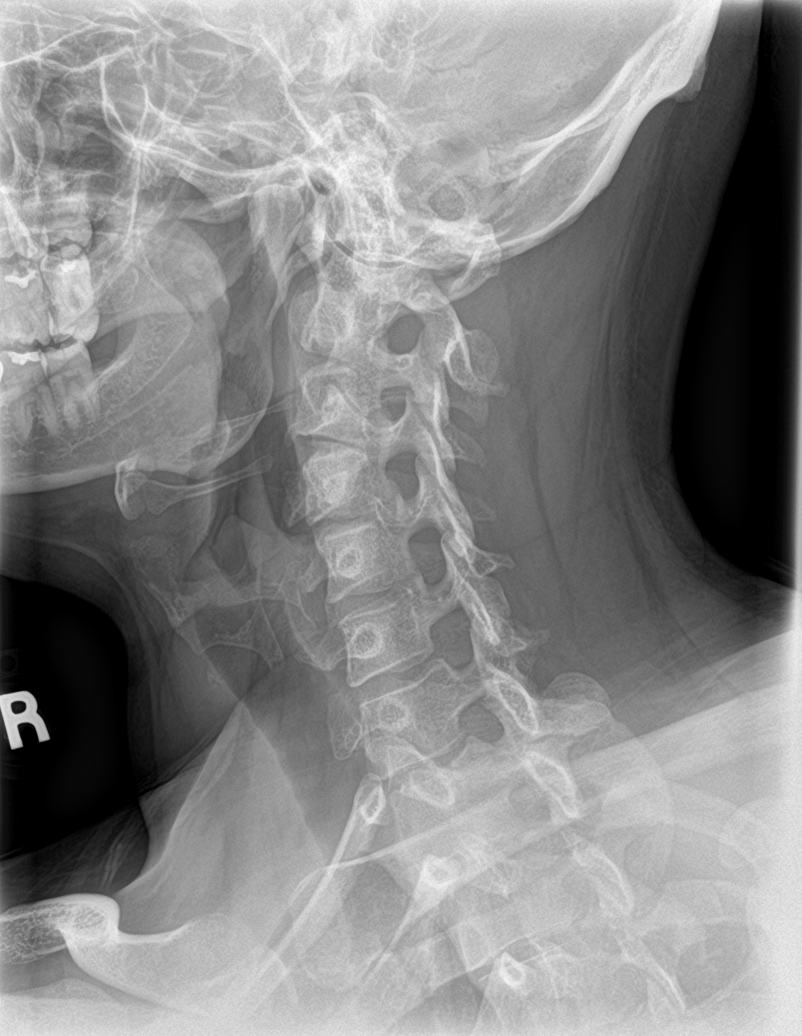
[im 4/6]
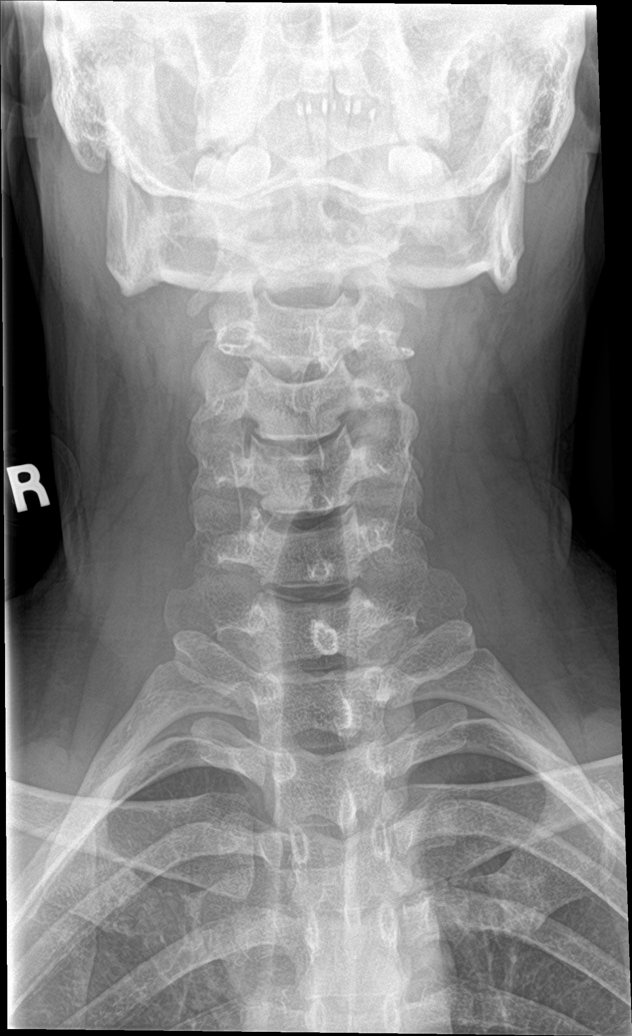
[im 5/6]
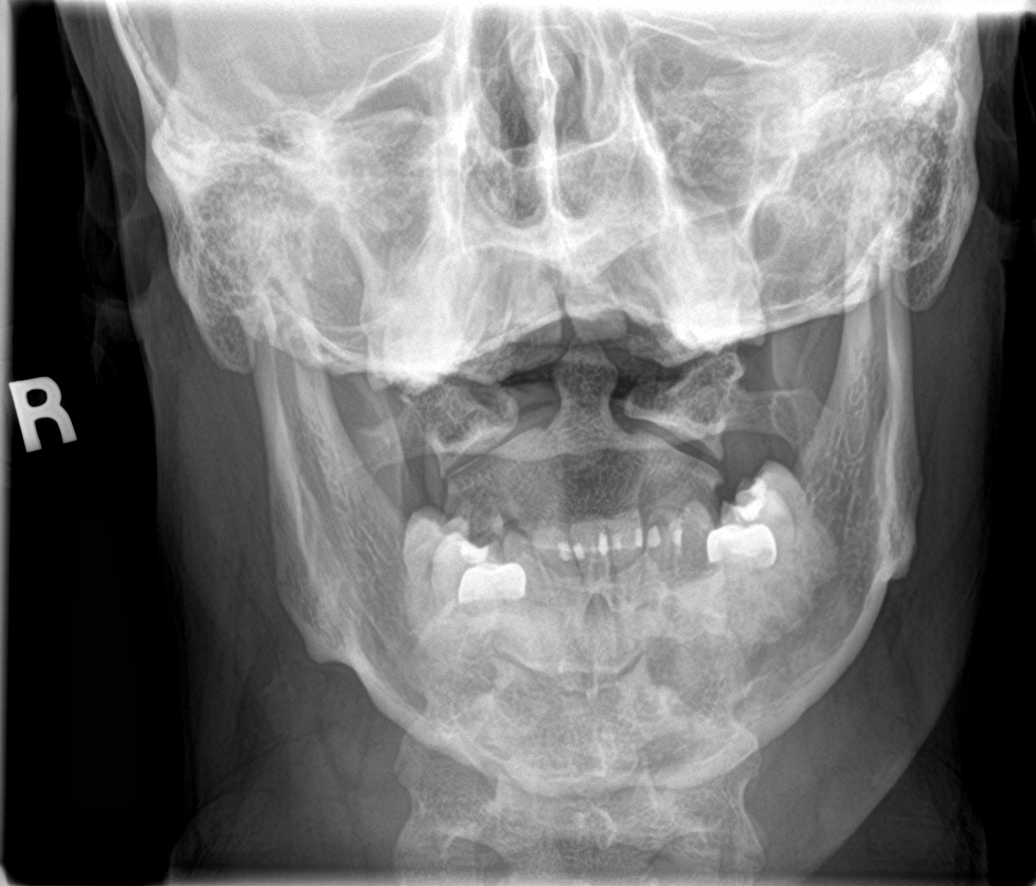
[im 6/6]
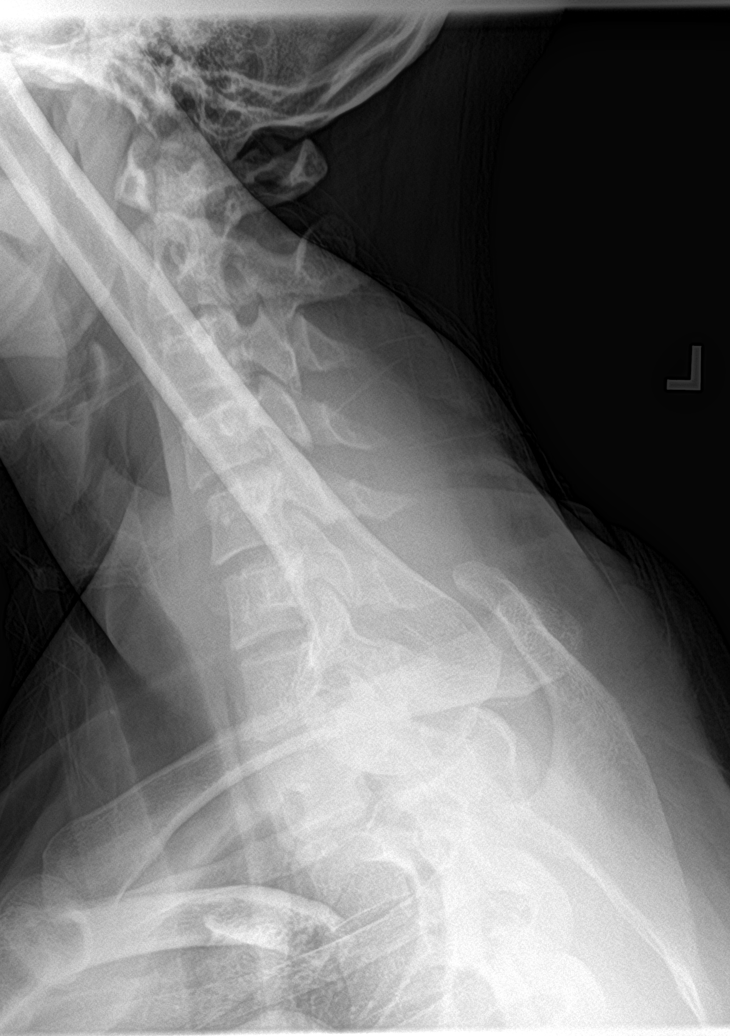

[6 of 6 positions shown; findings below may reference images not displayed]

FINDINGS: Cervical Spine:

Cervical elements maintain relative anatomic alignment from the
level of C1-T1. Unremarkable appearance of the craniocervical
junction.

No subluxation, anterolisthesis, retrolisthesis.

No acute fracture line identified.

Vertebral body heights maintained.

Disc spaces maintained, without significant disc disease.

Oblique images demonstrate no foraminal encroachment. No significant
facet disease.

Prevertebral soft tissues within normal limits.

Open mouth odontoid view unremarkable.
IMPRESSION: Negative cervical spine radiographs.

## 2021-11-06 ENCOUNTER — Ambulatory Visit: Payer: Self-pay | Admitting: *Deleted

## 2021-11-06 NOTE — Telephone Encounter (Signed)
° °  Chief Complaint: Knee pain Symptoms: Left knee pain, swelling 10/10 pain, "Knee gives out" Frequency: Tuesday Pertinent Negatives: Patient denies known injury, redness, warmth Disposition: [] ED /[x] Urgent Care (no appt availability in office) / [] Appointment(In office/virtual)/ []  [facility]/ [] Home Care/ [] Refused Recommended Disposition /[] [facility]/ []  Follow-up with PCP Additional Notes:    Reason for Disposition  [1] SEVERE pain (e.g., excruciating, unable to walk) AND [2] not improved after 2 hours of pain medicine  Answer Assessment - Initial Assessment Questions 1. LOCATION and RADIATION: "Where is the pain located?"      Left knee 2. QUALITY: "What does the pain feel like?"  (e.g., sharp, dull, aching, burning)     Varies 3. SEVERITY: "How bad is the pain?" "What does it keep you from doing?"   (Scale 1-10; or mild, moderate, severe)   -  MILD (1-3): doesn't interfere with normal activities    -  MODERATE (4-7): interferes with normal activities (e.g., work or school) or awakens from sleep, limping    -  SEVERE (8-10): excruciating pain, unable to do any normal activities, unable to walk     5/10 at rest..10/10 with walking 4. ONSET: "When did the pain start?" "Does it come and go, or is it there all the time?"     Tuesday 5. RECURRENT: "Have you had this pain before?" If Yes, ask: "When, and what happened then?"     Yes, "Arthritis" 6. SETTING: "Has there been any recent work, exercise or other activity that involved that part of the body?"      No 7. AGGRAVATING FACTORS: "What makes the knee pain worse?" (e.g., walking, climbing stairs, running)     Ealking 8. ASSOCIATED SYMPTOMS: "Is there any swelling or redness of the knee?"     No 9. OTHER SYMPTOMS: "Do you have any other symptoms?" (e.g., chest pain, difficulty breathing, fever, calf pain)     Swelling entire knee, knee gives out  Protocols used: Knee Pain-A-AH

## 2022-11-02 ENCOUNTER — Ambulatory Visit: Payer: Medicaid Other | Admitting: Internal Medicine

## 2022-11-06 ENCOUNTER — Ambulatory Visit (INDEPENDENT_AMBULATORY_CARE_PROVIDER_SITE_OTHER): Payer: Medicaid Other | Admitting: Internal Medicine

## 2022-11-06 ENCOUNTER — Encounter: Payer: Self-pay | Admitting: Internal Medicine

## 2022-11-06 VITALS — BP 116/84 | HR 98 | Temp 97.5°F | Wt 223.0 lb

## 2022-11-06 DIAGNOSIS — Z0001 Encounter for general adult medical examination with abnormal findings: Secondary | ICD-10-CM | POA: Diagnosis not present

## 2022-11-06 DIAGNOSIS — E6609 Other obesity due to excess calories: Secondary | ICD-10-CM | POA: Insufficient documentation

## 2022-11-06 DIAGNOSIS — R7309 Other abnormal glucose: Secondary | ICD-10-CM | POA: Diagnosis not present

## 2022-11-06 DIAGNOSIS — Z0289 Encounter for other administrative examinations: Secondary | ICD-10-CM

## 2022-11-06 DIAGNOSIS — Z114 Encounter for screening for human immunodeficiency virus [HIV]: Secondary | ICD-10-CM | POA: Diagnosis not present

## 2022-11-06 DIAGNOSIS — Z1159 Encounter for screening for other viral diseases: Secondary | ICD-10-CM

## 2022-11-06 DIAGNOSIS — R739 Hyperglycemia, unspecified: Secondary | ICD-10-CM

## 2022-11-06 DIAGNOSIS — Z6832 Body mass index (BMI) 32.0-32.9, adult: Secondary | ICD-10-CM

## 2022-11-06 MED ORDER — FLUOXETINE HCL 20 MG/5ML PO SOLN: 40.0000 mg | Freq: Every day | ORAL | 3 refills | Status: DC

## 2022-11-06 NOTE — Patient Instructions (Signed)
Health Maintenance, Male Adopting a healthy lifestyle and getting preventive care are important in promoting health and wellness. Ask your health care provider about: The right schedule for you to have regular tests and exams. Things you can do on your own to prevent diseases and keep yourself healthy. What should I know about diet, weight, and exercise? Eat a healthy diet  Eat a diet that includes plenty of vegetables, fruits, low-fat dairy products, and lean protein. Do not eat a lot of foods that are high in solid fats, added sugars, or sodium. Maintain a healthy weight Body mass index (BMI) is a measurement that can be used to identify possible weight problems. It estimates body fat based on height and weight. Your health care provider can help determine your BMI and help you achieve or maintain a healthy weight. Get regular exercise Get regular exercise. This is one of the most important things you can do for your health. Most adults should: Exercise for at least 150 minutes each week. The exercise should increase your heart rate and make you sweat (moderate-intensity exercise). Do strengthening exercises at least twice a week. This is in addition to the moderate-intensity exercise. Spend less time sitting. Even light physical activity can be beneficial. Watch cholesterol and blood lipids Have your blood tested for lipids and cholesterol at 34 years of age, then have this test every 5 years. You Adcock need to have your cholesterol levels checked more often if: Your lipid or cholesterol levels are high. You are older than 34 years of age. You are at high risk for heart disease. What should I know about cancer screening? Many types of cancers can be detected early and Santore often be prevented. Depending on your health history and family history, you Petrakis need to have cancer screening at various ages. This Eleazer include screening for: Colorectal cancer. Prostate cancer. Skin cancer. Lung  cancer. What should I know about heart disease, diabetes, and high blood pressure? Blood pressure and heart disease High blood pressure causes heart disease and increases the risk of stroke. This is more likely to develop in people who have high blood pressure readings or are overweight. Talk with your health care provider about your target blood pressure readings. Have your blood pressure checked: Every 3-5 years if you are 18-39 years of age. Every year if you are 40 years old or older. If you are between the ages of 65 and 75 and are a current or former smoker, ask your health care provider if you should have a one-time screening for abdominal aortic aneurysm (AAA). Diabetes Have regular diabetes screenings. This checks your fasting blood sugar level. Have the screening done: Once every three years after age 45 if you are at a normal weight and have a low risk for diabetes. More often and at a younger age if you are overweight or have a high risk for diabetes. What should I know about preventing infection? Hepatitis B If you have a higher risk for hepatitis B, you should be screened for this virus. Talk with your health care provider to find out if you are at risk for hepatitis B infection. Hepatitis C Blood testing is recommended for: Everyone born from 1945 through 1965. Anyone with known risk factors for hepatitis C. Sexually transmitted infections (STIs) You should be screened each year for STIs, including gonorrhea and chlamydia, if: You are sexually active and are younger than 34 years of age. You are older than 34 years of age and your   health care provider tells you that you are at risk for this type of infection. Your sexual activity has changed since you were last screened, and you are at increased risk for chlamydia or gonorrhea. Ask your health care provider if you are at risk. Ask your health care provider about whether you are at high risk for HIV. Your health care provider  Nov[place]ovich recommend a prescription medicine to help prevent HIV infection. If you choose to take medicine to prevent HIV, you should first get tested for HIV. You should then be tested every 3 months for as long as you are taking the medicine. Follow these instructions at home: Alcohol use Do not drink alcohol if your health care provider tells you not to drink. If you drink alcohol: Limit how much you have to 0-2 drinks a day. Know how much alcohol is in your drink. In the [place] one drink equals one 12 oz bottle of beer (355 mL), one 5 oz glass of wine (148 mL), or one 1 oz glass of hard liquor (44 mL). Lifestyle Do not use any products that contain nicotine or tobacco. These products include cigarettes, chewing tobacco, and vaping devices, such as e-cigarettes. If you need help quitting, ask your health care provider. Do not use street drugs. Do not share needles. Ask your health care provider for help if you need support or information about quitting drugs. General instructions Schedule regular health, dental, and eye exams. Stay current with your vaccines. Tell your health care provider if: You often feel depressed. You have ever been abused or do not feel safe at home. Summary Adopting a healthy lifestyle and getting preventive care are important in promoting health and wellness. Follow your health care provider's instructions about healthy diet, exercising, and getting tested or screened for diseases. Follow your health care provider's instructions on monitoring your cholesterol and blood pressure. This information is not intended to replace advice given to you by your health care provider. Make sure you discuss any questions you have with your health care provider. Document Revised: 02/03/2021 Document Reviewed: 02/03/2021 Elsevier Patient Education  2023 Elsevier Inc.  

## 2022-11-06 NOTE — Assessment & Plan Note (Signed)
Encourage diet and exercise for weight loss 

## 2022-11-06 NOTE — Progress Notes (Signed)
Subjective:    Patient ID: Angel Montgomery, male    DOB: 1991-09-26, 32 y.o.   MRN: [id number]  HPI  Patient presents to clinic today for his annual exam and for follow-up of chronic conditions.  Anxiety and Depression: Chronic.  He is not currently taking any medications for this but was taking Fluoxetine in the past.  He is not currently seeing a therapist.  He denies SI/HI.  He also needs a letter for jury duty.  He reports he had a panic attack when he went last time.  Flu: Never Tetanus: >10 years ago COVID: Never Dentist: as needed  Diet: He does eat meat. He consumes fruits and veggies. He tries to avoid fried foods. He drinks coffee, gatorade, water Exercise: None  Review of Systems     No past medical history on file.  Current Outpatient Medications  Medication Sig Dispense Refill   FLUoxetine (PROZAC) 20 MG/5ML solution GIVE 2 & 1/2 (TWO & ONE-HALF) ML BY MOUTH  ONCE DAILY 80 mL 0   loratadine (CLARITIN) 10 MG tablet Take 10 mg by mouth daily.     methocarbamol (ROBAXIN) 500 MG tablet Take 1 tablet (500 mg total) by mouth every 8 (eight) hours as needed for muscle spasms (sedation precautions). 30 tablet 0   predniSONE (DELTASONE) 20 MG tablet Take two tablets daily for 4 days followed by one tablet daily for 4 days 12 tablet 0   No current facility-administered medications for this visit.    No Known Allergies  No family history on file.  Social History   Socioeconomic History   Marital status: Single    Spouse name: Not on file   Number of children: Not on file   Years of education: Not on file   Highest education level: Not on file  Occupational History   Not on file  Tobacco Use   Smoking status: Never   Smokeless tobacco: Never  Substance and Sexual Activity   Alcohol use: No   Drug use: No   Sexual activity: Not on file  Other Topics Concern   Not on file  Social History Narrative   Not on file   Social Determinants of Health   Financial  Resource Strain: Not on file  Food Insecurity: Not on file  Transportation Needs: Not on file  Physical Activity: Not on file  Stress: Not on file  Social Connections: Not on file  Intimate Partner Violence: Not on file     Constitutional: Denies fever, malaise, fatigue, headache or abrupt weight changes.  HEENT: Denies eye pain, eye redness, ear pain, ringing in the ears, wax buildup, runny nose, nasal congestion, bloody nose, or sore throat. Respiratory: Denies difficulty breathing, shortness of breath, cough or sputum production.   Cardiovascular: Denies chest pain, chest tightness, palpitations or swelling in the hands or feet.  Gastrointestinal: Denies abdominal pain, bloating, constipation, diarrhea or blood in the stool.  GU: Denies urgency, frequency, pain with urination, burning sensation, blood in urine, odor or discharge. Musculoskeletal: Denies decrease in range of motion, difficulty with gait, muscle pain or joint pain and swelling.  Skin: Denies redness, rashes, lesions or ulcercations.  Neurological: Denies dizziness, difficulty with memory, difficulty with speech or problems with balance and coordination.  Psych: Patient has a history of anxiety and depression.  Denies SI/HI.  No other specific complaints in a complete review of systems (except as listed in HPI above).  Objective:   Physical Exam  BP 116/84 (BP Location:  Right Arm, Patient Position: Sitting, Cuff Size: Normal)   Pulse 98   Temp (!) 97.5 F (36.4 C) (Temporal)   Wt 223 lb (101.2 kg)   SpO2 99%   BMI 32.93 kg/m   Wt Readings from Last 3 Encounters:  06/11/20 206 lb 5 oz (93.6 kg)  02/01/20 201 lb (91.2 kg)  06/06/16 200 lb (90.7 kg)    General: Appears his stated age, obese, in NAD. Skin: Warm, dry and intact. No rashes noted. HEENT: Head: normal shape and size; Eyes: sclera white, no icterus, conjunctiva pink, PERRLA and EOMs intact;  Neck:  Neck supple, trachea midline. No masses, lumps or  thyromegaly present.  Cardiovascular: Normal rate and rhythm. S1,S2 noted.  No murmur, rubs or gallops noted. No JVD or BLE edema.  Pulmonary/Chest: Normal effort and positive vesicular breath sounds. No respiratory distress. No wheezes, rales or ronchi noted.  Abdomen: Normal bowel sounds.  Musculoskeletal: Strength 5/5 BUE/BLE.  No difficulty with gait.  Neurological: Alert and oriented. Cranial nerves II-XII grossly intact. Coordination normal.  Psychiatric: Mood and affect flat. Behavior is normal. Judgment and thought content normal.     BMET    Component Value Date/Time   NA 137 06/06/2016 1620   K 3.9 06/06/2016 1620   CL 104 06/06/2016 1620   CO2 27 06/06/2016 1620   GLUCOSE 99 06/06/2016 1620   BUN 17 06/06/2016 1620   CREATININE 0.98 06/06/2016 1620   CALCIUM 9.8 06/06/2016 1620   GFRNONAA >60 06/06/2016 1620   GFRAA >60 06/06/2016 1620    Lipid Panel  No results found for: "CHOL", "TRIG", "HDL", "CHOLHDL", "VLDL", "LDLCALC"  CBC    Component Value Date/Time   WBC 7.7 06/06/2016 1620   RBC 5.53 06/06/2016 1620   HGB 16.5 06/06/2016 1620   HCT 46.5 06/06/2016 1620   PLT 304 06/06/2016 1620   MCV 84.0 06/06/2016 1620   MCH 29.8 06/06/2016 1620   MCHC 35.5 06/06/2016 1620   RDW 12.3 06/06/2016 1620   LYMPHSABS 1.9 10/23/2015 0816   MONOABS 0.3 10/23/2015 0816   EOSABS 0.4 10/23/2015 0816   BASOSABS 0.1 10/23/2015 0816    Hgb A1C No results found for: "HGBA1C"          Assessment & Plan:   Preventative Health Maintenance:  Encouraged him to get a flu shot in the fall He declines tetanus tetanus today Encouraged him to get his COVID-vaccine Encouraged him to consume a balanced diet and exercise regimen Advised him to see an eye doctor and dentist annually We will check CBC, c-Met, lipid, A1c, HIV and hep C today  RTC in 1 year, sooner if needed Webb Silversmith, NP

## 2022-11-07 LAB — COMPLETE METABOLIC PANEL WITH GFR
AG Ratio: 1.6 (calc) (ref 1.0–2.5)
ALT: 36 U/L (ref 9–46)
AST: 23 U/L (ref 10–40)
Albumin: 4.4 g/dL (ref 3.6–5.1)
Alkaline phosphatase (APISO): 73 U/L (ref 36–130)
BUN: 19 mg/dL (ref 7–25)
CO2: 24 mmol/L (ref 20–32)
Calcium: 9.4 mg/dL (ref 8.6–10.3)
Chloride: 104 mmol/L (ref 98–110)
Creat: 1.19 mg/dL (ref 0.60–1.26)
Globulin: 2.7 g/dL (calc) (ref 1.9–3.7)
Glucose, Bld: 120 mg/dL — ABNORMAL HIGH (ref 65–99)
Potassium: 4.4 mmol/L (ref 3.5–5.3)
Sodium: 138 mmol/L (ref 135–146)
Total Bilirubin: 0.4 mg/dL (ref 0.2–1.2)
Total Protein: 7.1 g/dL (ref 6.1–8.1)
eGFR: 83 mL/min/{1.73_m2} (ref >=60)

## 2022-11-07 LAB — LIPID PANEL
Cholesterol: 274 mg/dL — ABNORMAL HIGH (ref <200)
HDL: 59 mg/dL (ref >=40)
LDL Cholesterol (Calc): 168 mg/dL (calc) — ABNORMAL HIGH
Non-HDL Cholesterol (Calc): 215 mg/dL (calc) — ABNORMAL HIGH (ref <130)
Total CHOL/HDL Ratio: 4.6 (calc) (ref <5.0)
Triglycerides: 284 mg/dL — ABNORMAL HIGH (ref <150)

## 2022-11-07 LAB — CBC
HCT: 42.5 % (ref 38.5–50.0)
Hemoglobin: 15.2 g/dL (ref 13.2–17.1)
MCH: 30.4 pg (ref 27.0–33.0)
MCHC: 35.8 g/dL (ref 32.0–36.0)
MCV: 85 fL (ref 80.0–100.0)
MPV: 9.5 fL (ref 7.5–12.5)
Platelets: 322 10*3/uL (ref 140–400)
RBC: 5 10*6/uL (ref 4.20–5.80)
RDW: 13 % (ref 11.0–15.0)
WBC: 6.7 10*3/uL (ref 3.8–10.8)

## 2022-11-07 LAB — HEMOGLOBIN A1C
Hgb A1c MFr Bld: 5.7 % of total Hgb — ABNORMAL HIGH (ref <5.7)
Mean Plasma Glucose: 117 mg/dL
eAG (mmol/L): 6.5 mmol/L

## 2022-11-07 LAB — HIV ANTIBODY (ROUTINE TESTING W REFLEX): HIV 1&2 Ab, 4th Generation: NONREACTIVE

## 2022-11-07 LAB — HEPATITIS C ANTIBODY: Hepatitis C Ab: NONREACTIVE

## 2022-11-09 ENCOUNTER — Telehealth: Payer: Self-pay

## 2022-11-09 DIAGNOSIS — E782 Mixed hyperlipidemia: Secondary | ICD-10-CM

## 2022-11-09 DIAGNOSIS — R7303 Prediabetes: Secondary | ICD-10-CM

## 2022-11-09 NOTE — Telephone Encounter (Signed)
Received call from pt's Mom, Sybil. Shared provider's note.  Jearld Fenton, NP 11/07/2022 12:27 PM EST     Liver and kidney function is normal.  Cholesterol is elevated.  I would recommend cholesterol-lowering medication at this time.  A1c shows that he is prediabetic.  He should consume a low saturated fat, low carb diet and exercise for weight loss.  Blood counts are normal.    Mom states that if the cholesterol lowering medication is available as a liquid or chewable, please call it in. Pt cannot swallow pills.   Please advise.

## 2022-11-10 MED ORDER — ATORVASTATIN CALCIUM 20 MG/5ML PO SUSP: 20.0000 mg | Freq: Every day | ORAL | 1 refills | Status: DC

## 2022-11-10 NOTE — Telephone Encounter (Signed)
Ms. Rewis advised.  She is going to call back and schedule a lab only visit.   Thanks,   -Mickel Baas

## 2022-11-10 NOTE — Addendum Note (Signed)
Addended by: Jearld Fenton on: 11/10/2022 09:32 AM   Modules accepted: Orders

## 2022-11-10 NOTE — Telephone Encounter (Signed)
Rx for liquid atorvastatin sent to pharmacy.  Please have him schedule lab only appointment in 3 months to recheck cholesterol levels

## 2022-11-17 ENCOUNTER — Encounter: Payer: Self-pay | Admitting: Internal Medicine

## 2023-10-26 ENCOUNTER — Encounter: Payer: Self-pay | Admitting: Internal Medicine

## 2023-10-26 ENCOUNTER — Ambulatory Visit: Payer: Medicaid Other | Admitting: Internal Medicine

## 2023-10-26 VITALS — BP 120/84 | HR 84 | Temp 98.0°F | Ht 69.0 in | Wt 210.0 lb

## 2023-10-26 DIAGNOSIS — J00 Acute nasopharyngitis [common cold]: Secondary | ICD-10-CM

## 2023-10-26 MED ORDER — PROMETHAZINE-DM 6.25-15 MG/5ML PO SYRP: 5.0000 mL | ORAL_SOLUTION | Freq: Four times a day (QID) | ORAL | 0 refills | Status: DC | PRN

## 2023-10-26 MED ORDER — AZITHROMYCIN 250 MG PO TABS: ORAL_TABLET | ORAL | 0 refills | Status: DC

## 2023-10-26 MED ORDER — AZITHROMYCIN 200 MG/5ML PO SUSR: ORAL | 0 refills | Status: DC

## 2023-10-26 NOTE — Patient Instructions (Signed)

## 2023-10-26 NOTE — Progress Notes (Signed)
Subjective:    Patient ID: Angel Montgomery, male    DOB: Dec 08, 1990, 32 y.o.   MRN: [id number]  HPI  Discussed the use of AI scribe software for clinical note transcription with the patient, who gave verbal consent to proceed.  The patient presents with symptoms of an upper respiratory infection. His mother was also sick recently.  He has been experiencing symptoms for the past five to six days, including headaches, nasal congestion, and intermittent fever with a maximum temperature of 100F. He reports mild body aches and persistent chest congestion, but no runny nose, ear pain, sore throat, cough with sputum production, nausea, vomiting, diarrhea, or shortness of breath. He feels he is slowly improving.  He has been using Mucinex and Tylenol occasionally for symptom relief.      Review of Systems   No past medical history on file.  Current Outpatient Medications  Medication Sig Dispense Refill   Atorvastatin Calcium 20 MG/5ML SUSP Take 20 mg by mouth at bedtime. 450 mL 1   FLUoxetine (PROZAC) 20 MG/5ML solution Take 10 mLs (40 mg total) by mouth daily. 900 mL 3   No current facility-administered medications for this visit.    No Known Allergies  Family History  Problem Relation Age of Onset   Diabetes Mother    Hypertension Mother    Hyperlipidemia Mother    GER disease Mother    Anxiety disorder Mother    Depression Mother    Heart disease Father    Chronic bronchitis Father    Diabetes Father    Hyperlipidemia Father    Colon cancer Neg Hx    Prostate cancer Neg Hx     Social History   Socioeconomic History   Marital status: Single    Spouse name: Not on file   Number of children: Not on file   Years of education: Not on file   Highest education level: Not on file  Occupational History   Not on file  Tobacco Use   Smoking status: Never   Smokeless tobacco: Never  Substance and Sexual Activity   Alcohol use: No   Drug use: No   Sexual activity: Not on  file  Other Topics Concern   Not on file  Social History Narrative   Not on file   Social Drivers of Health   Financial Resource Strain: Not on file  Food Insecurity: Not on file  Transportation Needs: Not on file  Physical Activity: Not on file  Stress: Not on file  Social Connections: Not on file  Intimate Partner Violence: Not on file     Constitutional: Pt reports headache, fever. Denies fever, malaise, fatigue, or abrupt weight changes.  HEENT: Pt reports nasal congestion. Denies eye pain, eye redness, ear pain, ringing in the ears, wax buildup, runny nose, bloody nose, or sore throat. Respiratory: Pt reports cough and shortness of breath. Denies difficulty breathing, or sputum production.   Cardiovascular: Denies chest pain, chest tightness, palpitations or swelling in the hands or feet.  Gastrointestinal: Denies abdominal pain, bloating, constipation, diarrhea or blood in the stool.  Musculoskeletal: Pt reports body aches. Denies decrease in range of motion, difficulty with gait, or joint swelling.  Skin: Denies redness, rashes, lesions or ulcercations.  Neurological: Denies dizziness, difficulty with memory, difficulty with speech or problems with balance and coordination.  Psych: Denies anxiety, depression, SI/HI.  No other specific complaints in a complete review of systems (except as listed in HPI above).  Objective:   Physical Exam  BP 120/84 (BP Location: Right Arm, Patient Position: Sitting, Cuff Size: Normal)   Pulse 84   Temp 98 F (36.7 C)   Ht 5\' 9"  (1.753 m)   Wt 210 lb (95.3 kg)   SpO2 99%   BMI 31.01 kg/m   Wt Readings from Last 3 Encounters:  11/06/22 223 lb (101.2 kg)  06/11/20 206 lb 5 oz (93.6 kg)  02/01/20 201 lb (91.2 kg)    General: Appears his stated age, obese in NAD. Skin: Warm, dry and intact. No rashes  noted. HEENT: Head: normal shape and size, no sinus tenderness noted; Eyes: sclera white, no icterus, conjunctiva pink, PERRLA  and EOMs intact;  Nose: mucosa boggy and moist, septum midline; Throat/Mouth: Teeth present, mucosa erythematous and moist, + PND, no exudate, lesions or ulcerations noted.  Neck: No adenopathy noted. Cardiovascular: Normal rate and rhythm. S1,S2 noted.  No murmur, rubs or gallops noted. Pulmonary/Chest: Normal effort and positive vesicular breath sounds. No respiratory distress. No wheezes, rales or ronchi noted.  Neurological: Alert and oriented.   BMET    Component Value Date/Time   NA 138 11/06/2022 1455   K 4.4 11/06/2022 1455   CL 104 11/06/2022 1455   CO2 24 11/06/2022 1455   GLUCOSE 120 (H) 11/06/2022 1455   BUN 19 11/06/2022 1455   CREATININE 1.19 11/06/2022 1455   CALCIUM 9.4 11/06/2022 1455   GFRNONAA >60 06/06/2016 1620   GFRAA >60 06/06/2016 1620    Lipid Panel     Component Value Date/Time   CHOL 274 (H) 11/06/2022 1455   TRIG 284 (H) 11/06/2022 1455   HDL 59 11/06/2022 1455   CHOLHDL 4.6 11/06/2022 1455   LDLCALC 168 (H) 11/06/2022 1455    CBC    Component Value Date/Time   WBC 6.7 11/06/2022 1455   RBC 5.00 11/06/2022 1455   HGB 15.2 11/06/2022 1455   HCT 42.5 11/06/2022 1455   PLT 322 11/06/2022 1455   MCV 85.0 11/06/2022 1455   MCH 30.4 11/06/2022 1455   MCHC 35.8 11/06/2022 1455   RDW 13.0 11/06/2022 1455   LYMPHSABS 1.9 10/23/2015 0816   MONOABS 0.3 10/23/2015 0816   EOSABS 0.4 10/23/2015 0816   BASOSABS 0.1 10/23/2015 0816    Hgb A1C Lab Results  Component Value Date   HGBA1C 5.7 (H) 11/06/2022            Assessment & Plan:    Assessment and Plan    Upper Respiratory Infection Symptoms of headache, congestion, cough, intermittent fever, and body aches for 5-6 days. Physical exam unremarkable. Differential includes viral vs bacterial etiology. -Prescribe Azithromycin (Z-Pak) for possible bacterial infection. -Prescribe Promethazine DM cough syrup for symptomatic relief, to be used at bedtime due to potential sedative  effects.   Schedule an appt for your annual exam Nicki Reaper, NP

## 2024-02-29 ENCOUNTER — Encounter: Admitting: Internal Medicine

## 2024-03-22 ENCOUNTER — Encounter: Admitting: Internal Medicine

## 2024-03-22 NOTE — Progress Notes (Deleted)
 Subjective:    Patient ID: Angel Montgomery, male    DOB: Mar 08, 1991, 33 y.o.   MRN: [id number]  HPI  Patient presents to clinic today for his annual exam.  Flu: Never Tetanus: >10 years ago COVID: Never Dentist: as needed  Diet: He does eat meat. He consumes fruits and veggies. He tries to avoid fried foods. He drinks coffee, gatorade, water Exercise: None  Review of Systems     No past medical history on file.  Current Outpatient Medications  Medication Sig Dispense Refill   Atorvastatin  Calcium  20 MG/5ML SUSP Take 20 mg by mouth at bedtime. 450 mL 1   azithromycin  (ZITHROMAX ) 200 MG/5ML suspension 500 mg PO x 1 on day followed by 250 mg PO daily x 4 days thereafter 37.5 mL 0   FLUoxetine  (PROZAC ) 20 MG/5ML solution Take 10 mLs (40 mg total) by mouth daily. (Patient not taking: Reported on 10/26/2023) 900 mL 3   promethazine -dextromethorphan (PROMETHAZINE -DM) 6.25-15 MG/5ML syrup Take 5 mLs by mouth 4 (four) times daily as needed. 118 mL 0   No current facility-administered medications for this visit.    No Known Allergies  Family History  Problem Relation Age of Onset   Diabetes Mother    Hypertension Mother    Hyperlipidemia Mother    GER disease Mother    Anxiety disorder Mother    Depression Mother    Heart disease Father    Chronic bronchitis Father    Diabetes Father    Hyperlipidemia Father    Colon cancer Neg Hx    Prostate cancer Neg Hx     Social History   Socioeconomic History   Marital status: Single    Spouse name: Not on file   Number of children: Not on file   Years of education: Not on file   Highest education level: Not on file  Occupational History   Not on file  Tobacco Use   Smoking status: Never   Smokeless tobacco: Never  Substance and Sexual Activity   Alcohol use: No   Drug use: No   Sexual activity: Not on file  Other Topics Concern   Not on file  Social History Narrative   Not on file   Social Drivers of Health    Financial Resource Strain: Not on file  Food Insecurity: Not on file  Transportation Needs: Not on file  Physical Activity: Not on file  Stress: Not on file  Social Connections: Not on file  Intimate Partner Violence: Not on file     Constitutional: Denies fever, malaise, fatigue, headache or abrupt weight changes.  HEENT: Denies eye pain, eye redness, ear pain, ringing in the ears, wax buildup, runny nose, nasal congestion, bloody nose, or sore throat. Respiratory: Denies difficulty breathing, shortness of breath, cough or sputum production.   Cardiovascular: Denies chest pain, chest tightness, palpitations or swelling in the hands or feet.  Gastrointestinal: Denies abdominal pain, bloating, constipation, diarrhea or blood in the stool.  GU: Denies urgency, frequency, pain with urination, burning sensation, blood in urine, odor or discharge. Musculoskeletal: Denies decrease in range of motion, difficulty with gait, muscle pain or joint pain and swelling.  Skin: Denies redness, rashes, lesions or ulcercations.  Neurological: Denies dizziness, difficulty with memory, difficulty with speech or problems with balance and coordination.  Psych: Patient has a history of anxiety and depression.  Denies SI/HI.  No other specific complaints in a complete review of systems (except as listed in HPI above).  Objective:  Physical Exam  There were no vitals taken for this visit.  Wt Readings from Last 3 Encounters:  10/26/23 210 lb (95.3 kg)  11/06/22 223 lb (101.2 kg)  06/11/20 206 lb 5 oz (93.6 kg)    General: Appears his stated age, obese, in NAD. Skin: Warm, dry and intact. No rashes noted. HEENT: Head: normal shape and size; Eyes: sclera white, no icterus, conjunctiva pink, PERRLA and EOMs intact;  Neck:  Neck supple, trachea midline. No masses, lumps or thyromegaly present.  Cardiovascular: Normal rate and rhythm. S1,S2 noted.  No murmur, rubs or gallops noted. No JVD or BLE edema.   Pulmonary/Chest: Normal effort and positive vesicular breath sounds. No respiratory distress. No wheezes, rales or ronchi noted.  Abdomen: Normal bowel sounds.  Musculoskeletal: Strength 5/5 BUE/BLE.  No difficulty with gait.  Neurological: Alert and oriented. Cranial nerves II-XII grossly intact. Coordination normal.  Psychiatric: Mood and affect flat. Behavior is normal. Judgment and thought content normal.     BMET    Component Value Date/Time   NA 138 11/06/2022 1455   K 4.4 11/06/2022 1455   CL 104 11/06/2022 1455   CO2 24 11/06/2022 1455   GLUCOSE 120 (H) 11/06/2022 1455   BUN 19 11/06/2022 1455   CREATININE 1.19 11/06/2022 1455   CALCIUM  9.4 11/06/2022 1455   GFRNONAA >60 06/06/2016 1620   GFRAA >60 06/06/2016 1620    Lipid Panel     Component Value Date/Time   CHOL 274 (H) 11/06/2022 1455   TRIG 284 (H) 11/06/2022 1455   HDL 59 11/06/2022 1455   CHOLHDL 4.6 11/06/2022 1455   LDLCALC 168 (H) 11/06/2022 1455    CBC    Component Value Date/Time   WBC 6.7 11/06/2022 1455   RBC 5.00 11/06/2022 1455   HGB 15.2 11/06/2022 1455   HCT 42.5 11/06/2022 1455   PLT 322 11/06/2022 1455   MCV 85.0 11/06/2022 1455   MCH 30.4 11/06/2022 1455   MCHC 35.8 11/06/2022 1455   RDW 13.0 11/06/2022 1455   LYMPHSABS 1.9 10/23/2015 0816   MONOABS 0.3 10/23/2015 0816   EOSABS 0.4 10/23/2015 0816   BASOSABS 0.1 10/23/2015 0816    Hgb A1C Lab Results  Component Value Date   HGBA1C 5.7 (H) 11/06/2022            Assessment & Plan:   Preventative Health Maintenance:  Encouraged him to get a flu shot in the fall He declines tetanus tetanus today Encouraged him to get his COVID-vaccine Encouraged him to consume a balanced diet and exercise regimen Advised him to see an eye doctor and dentist annually We will check CBC, c-Met, lipid, A1c today  RTC in 6 months, follow-up chronic conditions Angeline Laura, NP

## 2024-04-13 ENCOUNTER — Encounter: Payer: Self-pay | Admitting: Internal Medicine

## 2024-04-13 ENCOUNTER — Ambulatory Visit: Admitting: Internal Medicine

## 2024-04-13 ENCOUNTER — Ambulatory Visit: Payer: Self-pay

## 2024-04-13 VITALS — BP 132/78 | Ht 69.0 in | Wt 213.8 lb

## 2024-04-13 DIAGNOSIS — R7303 Prediabetes: Secondary | ICD-10-CM | POA: Insufficient documentation

## 2024-04-13 DIAGNOSIS — E782 Mixed hyperlipidemia: Secondary | ICD-10-CM | POA: Diagnosis not present

## 2024-04-13 DIAGNOSIS — F419 Anxiety disorder, unspecified: Secondary | ICD-10-CM

## 2024-04-13 DIAGNOSIS — E783 Hyperchylomicronemia: Secondary | ICD-10-CM | POA: Diagnosis not present

## 2024-04-13 DIAGNOSIS — E785 Hyperlipidemia, unspecified: Secondary | ICD-10-CM | POA: Insufficient documentation

## 2024-04-13 DIAGNOSIS — F32A Depression, unspecified: Secondary | ICD-10-CM

## 2024-04-13 MED ORDER — FLUOXETINE HCL 10 MG PO CAPS: 10.0000 mg | ORAL_CAPSULE | Freq: Every day | ORAL | 1 refills | Status: DC

## 2024-04-13 NOTE — Progress Notes (Signed)
 Subjective:    Patient ID: Angel Montgomery, male    DOB: 05/28/1991, 33 y.o.   MRN: [id number]  HPI  Patient presents to clinic today for follow-up of chronic conditions.  Anxiety and depression: Chronic although he does feel like this feel like this has been worse in the last 2-3 days.  He is not currently taking any medications for this but was taking fluoxetine  in the past.  He is not currently seeing a therapist.  He denies SI/HI.  Prediabetes: His last A1c was 5.7%, 10/2022.  He is not taking any oral diabetic medication at this time.  He does not check his sugars.  HLD: His last LDL was 164, triglycerides 268, 10/2022.  He is not taking any cholesterol-lowering medication at this time.  He does consume a low-fat diet.  Review of Systems     No past medical history on file.  Current Outpatient Medications  Medication Sig Dispense Refill   Atorvastatin  Calcium  20 MG/5ML SUSP Take 20 mg by mouth at bedtime. 450 mL 1   azithromycin  (ZITHROMAX ) 200 MG/5ML suspension 500 mg PO x 1 on day followed by 250 mg PO daily x 4 days thereafter 37.5 mL 0   FLUoxetine  (PROZAC ) 20 MG/5ML solution Take 10 mLs (40 mg total) by mouth daily. (Patient not taking: Reported on 10/26/2023) 900 mL 3   promethazine -dextromethorphan (PROMETHAZINE -DM) 6.25-15 MG/5ML syrup Take 5 mLs by mouth 4 (four) times daily as needed. 118 mL 0   No current facility-administered medications for this visit.    No Known Allergies  Family History  Problem Relation Age of Onset   Diabetes Mother    Hypertension Mother    Hyperlipidemia Mother    GER disease Mother    Anxiety disorder Mother    Depression Mother    Heart disease Father    Chronic bronchitis Father    Diabetes Father    Hyperlipidemia Father    Colon cancer Neg Hx    Prostate cancer Neg Hx     Social History   Socioeconomic History   Marital status: Single    Spouse name: Not on file   Number of children: Not on file   Years of education: Not  on file   Highest education level: Not on file  Occupational History   Not on file  Tobacco Use   Smoking status: Never   Smokeless tobacco: Never  Substance and Sexual Activity   Alcohol use: No   Drug use: No   Sexual activity: Not on file  Other Topics Concern   Not on file  Social History Narrative   Not on file   Social Drivers of Health   Financial Resource Strain: Not on file  Food Insecurity: Not on file  Transportation Needs: Not on file  Physical Activity: Not on file  Stress: Not on file  Social Connections: Not on file  Intimate Partner Violence: Not on file     Constitutional: Denies fever, malaise, fatigue, headache or abrupt weight changes.  HEENT: Denies eye pain, eye redness, ear pain, ringing in the ears, wax buildup, runny nose, nasal congestion, bloody nose, or sore throat. Respiratory: Denies difficulty breathing, shortness of breath, cough or sputum production.   Cardiovascular: Patient reports chest tightness.  Denies chest pain, palpitations or swelling in the hands or feet.  Gastrointestinal: Denies abdominal pain, bloating, constipation, diarrhea or blood in the stool.  GU: Denies urgency, frequency, pain with urination, burning sensation, blood in urine, odor or  discharge. Musculoskeletal: Denies decrease in range of motion, difficulty with gait, muscle pain or joint pain and swelling.  Skin: Denies redness, rashes, lesions or ulcercations.  Neurological: Pt reports jitteriness. Denies dizziness, difficulty with memory, difficulty with speech or problems with balance and coordination.  Psych: Patient has a history of anxiety and depression.  Denies SI/HI.  No other specific complaints in a complete review of systems (except as listed in HPI above).  Objective:   Physical Exam  BP 132/78 (BP Location: Right Arm, Patient Position: Sitting, Cuff Size: Normal)   Ht 5' 9 (1.753 m)   Wt 213 lb 12.8 oz (97 kg)   BMI 31.57 kg/m    Wt Readings  from Last 3 Encounters:  10/26/23 210 lb (95.3 kg)  11/06/22 223 lb (101.2 kg)  06/11/20 206 lb 5 oz (93.6 kg)    General: Appears his stated age, obese, in NAD. Cardiovascular: Normal rate and rhythm. S1,S2 noted.  No murmur, rubs or gallops noted. No JVD or BLE edema.  Pulmonary/Chest: Normal effort and positive vesicular breath sounds. No respiratory distress. No wheezes, rales or ronchi noted.  Musculoskeletal:  No difficulty with gait.  Neurological: Alert and oriented. Coordination normal.  Psychiatric: Mood and affect flat. Behavior is normal. Judgment and thought content normal.     BMET    Component Value Date/Time   NA 138 11/06/2022 1455   K 4.4 11/06/2022 1455   CL 104 11/06/2022 1455   CO2 24 11/06/2022 1455   GLUCOSE 120 (H) 11/06/2022 1455   BUN 19 11/06/2022 1455   CREATININE 1.19 11/06/2022 1455   CALCIUM  9.4 11/06/2022 1455   GFRNONAA >60 06/06/2016 1620   GFRAA >60 06/06/2016 1620    Lipid Panel     Component Value Date/Time   CHOL 274 (H) 11/06/2022 1455   TRIG 284 (H) 11/06/2022 1455   HDL 59 11/06/2022 1455   CHOLHDL 4.6 11/06/2022 1455   LDLCALC 168 (H) 11/06/2022 1455    CBC    Component Value Date/Time   WBC 6.7 11/06/2022 1455   RBC 5.00 11/06/2022 1455   HGB 15.2 11/06/2022 1455   HCT 42.5 11/06/2022 1455   PLT 322 11/06/2022 1455   MCV 85.0 11/06/2022 1455   MCH 30.4 11/06/2022 1455   MCHC 35.8 11/06/2022 1455   RDW 13.0 11/06/2022 1455   LYMPHSABS 1.9 10/23/2015 0816   MONOABS 0.3 10/23/2015 0816   EOSABS 0.4 10/23/2015 0816   BASOSABS 0.1 10/23/2015 0816    Hgb A1C Lab Results  Component Value Date   HGBA1C 5.7 (H) 11/06/2022            Assessment & Plan:     Schedule appointment for your annual exam Angeline Laura, NP

## 2024-04-13 NOTE — Assessment & Plan Note (Signed)
 Deteriorated Will restart fluoxetine  10 milligrams daily He declines referral to a therapist at this time Support offered

## 2024-04-13 NOTE — Patient Instructions (Signed)

## 2024-04-13 NOTE — Telephone Encounter (Signed)
 Will discuss at upcoming appointment

## 2024-04-13 NOTE — Telephone Encounter (Signed)
 FYI Only or Action Required?: FYI only for provider.  Patient was last seen in primary care on 10/26/2023 by Antonette Angeline ORN, NP.  Called Nurse Triage reporting Anxiety.  Symptoms began several days ago.  Interventions attempted: Nothing.  Symptoms are: unchanged.  Triage Disposition: See PCP When Office is Open (Within 3 Days)  Patient/caregiver understands and will follow disposition?: Yes  Appt scheduled 1100 with PCP today   Copied from CRM [id number]. Topic: Clinical - Red Word Triage >> Apr 13, 2024 10:06 AM Turkey B wrote: [facility] that prompted transfer to Nurse Triage: pt has extreme anxiety going on Reason for Disposition  MODERATE anxiety (e.g., persistent or frequent anxiety symptoms; interferes with sleep, school, or work)  Answer Assessment - Initial Assessment Questions 1. CONCERN: Did anything happen that prompted you to call today?      Not feeling right and anxiety  2. ANXIETY SYMPTOMS: Can you describe how you (your loved one; patient) have been feeling? (e.g., tense, restless, panicky, anxious, keyed up, overwhelmed, sense of impending doom).      Anxiety  3. ONSET: How long have you been feeling this way? (e.g., hours, days, weeks)     Several days  4. SEVERITY: How would you rate the level of anxiety? (e.g., 0 - 10; or mild, moderate, severe).     Mild to moderate  5. FUNCTIONAL IMPAIRMENT: How have these feelings affected your ability to do daily activities? Have you had more difficulty than usual doing your normal daily activities? (e.g., getting better, same, worse; self-care, school, work, interactions)     yes 6. HISTORY: Have you felt this way before? Have you ever been diagnosed with an anxiety problem in the past? (e.g., generalized anxiety disorder, panic attacks, PTSD). If Yes, ask: How was this problem treated? (e.g., medicines, counseling, etc.)     Yes was on Prozac  but not taken d/t expired  7. RISK OF HARM - SUICIDAL IDEATION:  Do you ever have thoughts of hurting or killing yourself? If Yes, ask:  Do you have these feelings now? Do you have a plan on how you would do this?     no 8. TREATMENT:  What has been done so far to treat this anxiety? (e.g., medicines, relaxation strategies). What has helped?     Nothing  11. PATIENT SUPPORT: Who is with you now? Who do you live with? Do you have family or friends who you can talk to?        Pt's mother 42. OTHER SYMPTOMS: Do you have any other symptoms? (e.g., feeling depressed, trouble concentrating, trouble sleeping, trouble breathing, palpitations or fast heartbeat, chest pain, sweating, nausea, or diarrhea)       Difficulty sleeping, chest pain has resolved, jittery  Protocols used: Anxiety and Panic Attack-A-AH

## 2024-04-13 NOTE — Assessment & Plan Note (Signed)
 A1c today Encourage low-carb diet and exercise for weight loss

## 2024-04-13 NOTE — Assessment & Plan Note (Signed)
 C-Met and lipid profile today Encouraged him to consume a low-fat diet

## 2024-04-14 ENCOUNTER — Ambulatory Visit: Payer: Self-pay | Admitting: Internal Medicine

## 2024-04-14 LAB — HEMOGLOBIN A1C
Hgb A1c MFr Bld: 5.6 % (ref <5.7)
Mean Plasma Glucose: 114 mg/dL
eAG (mmol/L): 6.3 mmol/L

## 2024-04-14 LAB — COMPREHENSIVE METABOLIC PANEL WITH GFR
AG Ratio: 2 (calc) (ref 1.0–2.5)
ALT: 23 U/L (ref 9–46)
AST: 18 U/L (ref 10–40)
Albumin: 4.5 g/dL (ref 3.6–5.1)
Alkaline phosphatase (APISO): 73 U/L (ref 36–130)
BUN: 15 mg/dL (ref 7–25)
CO2: 27 mmol/L (ref 20–32)
Calcium: 9.5 mg/dL (ref 8.6–10.3)
Chloride: 104 mmol/L (ref 98–110)
Creat: 1.14 mg/dL (ref 0.60–1.26)
Globulin: 2.3 g/dL (ref 1.9–3.7)
Glucose, Bld: 89 mg/dL (ref 65–99)
Potassium: 4.5 mmol/L (ref 3.5–5.3)
Sodium: 138 mmol/L (ref 135–146)
Total Bilirubin: 0.5 mg/dL (ref 0.2–1.2)
Total Protein: 6.8 g/dL (ref 6.1–8.1)
eGFR: 86 mL/min/1.73m2 (ref >=60)

## 2024-04-14 LAB — CBC
HCT: 44.9 % (ref 38.5–50.0)
Hemoglobin: 15.5 g/dL (ref 13.2–17.1)
MCH: 29.9 pg (ref 27.0–33.0)
MCHC: 34.5 g/dL (ref 32.0–36.0)
MCV: 86.5 fL (ref 80.0–100.0)
MPV: 9.4 fL (ref 7.5–12.5)
Platelets: 283 Thousand/uL (ref 140–400)
RBC: 5.19 Million/uL (ref 4.20–5.80)
RDW: 12.6 % (ref 11.0–15.0)
WBC: 6 Thousand/uL (ref 3.8–10.8)

## 2024-04-14 LAB — LIPID PANEL
Cholesterol: 290 mg/dL — ABNORMAL HIGH (ref <200)
HDL: 53 mg/dL (ref >=40)
LDL Cholesterol (Calc): 193 mg/dL — ABNORMAL HIGH
Non-HDL Cholesterol (Calc): 237 mg/dL — ABNORMAL HIGH (ref <130)
Total CHOL/HDL Ratio: 5.5 (calc) — ABNORMAL HIGH (ref <5.0)
Triglycerides: 238 mg/dL — ABNORMAL HIGH (ref <150)

## 2024-04-14 MED ORDER — ATORVASTATIN CALCIUM 20 MG PO TABS: 20.0000 mg | ORAL_TABLET | Freq: Every day | ORAL | 1 refills | Status: AC

## 2024-04-16 ENCOUNTER — Encounter: Payer: Self-pay | Admitting: Internal Medicine

## 2024-04-17 MED ORDER — FAMOTIDINE 40 MG/5ML PO SUSR: 20.0000 mg | Freq: Two times a day (BID) | ORAL | 3 refills | Status: AC

## 2024-06-19 ENCOUNTER — Encounter: Payer: Self-pay | Admitting: Internal Medicine

## 2024-06-19 ENCOUNTER — Ambulatory Visit: Admitting: Internal Medicine

## 2024-06-19 VITALS — BP 124/88 | HR 87 | Ht 69.0 in | Wt 211.2 lb

## 2024-06-19 DIAGNOSIS — J00 Acute nasopharyngitis [common cold]: Secondary | ICD-10-CM | POA: Diagnosis not present

## 2024-06-19 MED ORDER — AZITHROMYCIN 200 MG/5ML PO SUSR: ORAL | 0 refills | Status: DC

## 2024-06-19 MED ORDER — PROMETHAZINE-DM 6.25-15 MG/5ML PO SYRP: 5.0000 mL | ORAL_SOLUTION | Freq: Four times a day (QID) | ORAL | 0 refills | Status: DC | PRN

## 2024-06-19 NOTE — Addendum Note (Signed)
 Addended by: ZELIA GAUZE D on: 06/19/2024 11:52 AM   Modules accepted: Orders

## 2024-06-19 NOTE — Patient Instructions (Signed)

## 2024-06-19 NOTE — Progress Notes (Signed)
 Subjective:    Patient ID: Angel Montgomery, male    DOB: 1990-12-15, 33 y.o.   MRN: [id number]  HPI  Discussed the use of AI scribe software for clinical note transcription with the patient, who gave verbal consent to proceed.  Angel Montgomery is a 33 year old male who presents with nasal congestion, cough and chest congestion.  He has been experiencing nasal and chest congestion for about a week, accompanied by a productive cough with green sputum. He also has a headache, runny nose with green nasal discharge.  He denies ear pain or sore throat but experiences mild shortness of breath at times. No nausea, vomiting, diarrhea, fever, chills, or body aches are present.  He has been taking Mucinex and Sudafed, which have provided some relief, though he notes persistent congestion.  He tested negative for COVID-19 using a home test kit. His mother has been sick, but no one else around him has been ill.       Review of Systems     No past medical history on file.  Current Outpatient Medications  Medication Sig Dispense Refill   atorvastatin  (LIPITOR) 20 MG tablet Take 1 tablet (20 mg total) by mouth daily. 90 tablet 1   famotidine  (PEPCID ) 40 MG/5ML suspension Take 2.5 mLs (20 mg total) by mouth 2 (two) times daily. 450 mL 3   FLUoxetine  (PROZAC ) 10 MG capsule Take 1 capsule (10 mg total) by mouth daily. 90 capsule 1   levocetirizine (XYZAL) 2.5 MG/5ML solution Take 2.5 mg by mouth every evening.     No current facility-administered medications for this visit.    No Known Allergies  Family History  Problem Relation Age of Onset   Diabetes Mother    Hypertension Mother    Hyperlipidemia Mother    GER disease Mother    Anxiety disorder Mother    Depression Mother    Heart disease Father    Chronic bronchitis Father    Diabetes Father    Hyperlipidemia Father    Colon cancer Neg Hx    Prostate cancer Neg Hx     Social History   Socioeconomic History   Marital status:  Single    Spouse name: Not on file   Number of children: Not on file   Years of education: Not on file   Highest education level: Not on file  Occupational History   Not on file  Tobacco Use   Smoking status: Never   Smokeless tobacco: Never  Substance and Sexual Activity   Alcohol use: No   Drug use: No   Sexual activity: Not on file  Other Topics Concern   Not on file  Social History Narrative   Not on file   Social Drivers of Health   Financial Resource Strain: Not on file  Food Insecurity: Not on file  Transportation Needs: Not on file  Physical Activity: Not on file  Stress: Not on file  Social Connections: Not on file  Intimate Partner Violence: Not on file     Constitutional: Pt reports headache. Denies fever, malaise, fatigue, or abrupt weight changes.  HEENT: Pt reports runny nose, nasal congestion. Denies eye pain, eye redness, ear pain, ringing in the ears, wax buildup, bloody nose, or sore throat. Respiratory: Pt reports cough and shortness of breath. Denies difficulty breathing.   Cardiovascular: Denies chest pain, chest tightness, palpitations or swelling in the hands or feet.  Gastrointestinal: Denies abdominal pain, bloating, constipation, diarrhea or blood in  the stool.  Musculoskeletal: Denies decrease in range of motion, difficulty with gait, muscle pain or joint pain and swelling.  Skin: Denies redness, rashes, lesions or ulcercations.  Neurological: Denies dizziness, difficulty with memory, difficulty with speech or problems with balance and coordination.   No other specific complaints in a complete review of systems (except as listed in HPI above).  Objective:   Physical Exam BP 124/88 (BP Location: Left Arm, Patient Position: Sitting, Cuff Size: Normal)   Pulse 87   Ht 5' 9 (1.753 m)   Wt 211 lb 3.2 oz (95.8 kg)   SpO2 100%   BMI 31.19 kg/m     Wt Readings from Last 3 Encounters:  04/13/24 213 lb 12.8 oz (97 kg)  10/26/23 210 lb (95.3  kg)  11/06/22 223 lb (101.2 kg)    General: Appears his stated age, obese, in NAD. HEENT: Head: normal shape and size, no sinus tenderness noted; Nose: mucosa pink and moist, septum midline; Throat: mucosa pink and moist, no exudate, lesions noted. Tonsil's not enlarged. Neck: No adenopathy noted.  Cardiovascular: Normal rate and rhythm, no sinus tenderness noted. S1,S2 noted.  No murmur, rubs or gallops noted.  Pulmonary/Chest: Normal effort and positive vesicular breath sounds. No respiratory distress. No wheezes, rales or ronchi noted.  Musculoskeletal:  No difficulty with gait.  Neurological: Alert and oriented. Coordination normal.    BMET    Component Value Date/Time   NA 138 04/13/2024 1109   K 4.5 04/13/2024 1109   CL 104 04/13/2024 1109   CO2 27 04/13/2024 1109   GLUCOSE 89 04/13/2024 1109   BUN 15 04/13/2024 1109   CREATININE 1.14 04/13/2024 1109   CALCIUM  9.5 04/13/2024 1109   GFRNONAA >60 06/06/2016 1620   GFRAA >60 06/06/2016 1620    Lipid Panel     Component Value Date/Time   CHOL 290 (H) 04/13/2024 1109   TRIG 238 (H) 04/13/2024 1109   HDL 53 04/13/2024 1109   CHOLHDL 5.5 (H) 04/13/2024 1109   LDLCALC 193 (H) 04/13/2024 1109    CBC    Component Value Date/Time   WBC 6.0 04/13/2024 1109   RBC 5.19 04/13/2024 1109   HGB 15.5 04/13/2024 1109   HCT 44.9 04/13/2024 1109   PLT 283 04/13/2024 1109   MCV 86.5 04/13/2024 1109   MCH 29.9 04/13/2024 1109   MCHC 34.5 04/13/2024 1109   RDW 12.6 04/13/2024 1109   LYMPHSABS 1.9 10/23/2015 0816   MONOABS 0.3 10/23/2015 0816   EOSABS 0.4 10/23/2015 0816   BASOSABS 0.1 10/23/2015 0816    Hgb A1C Lab Results  Component Value Date   HGBA1C 5.6 04/13/2024            Assessment & Plan:   Assessment and Plan    Acute upper respiratory infection with productive cough Acute upper respiratory infection with productive cough for one week. Symptoms suggest viral or bacterial etiology. Negative COVID test.  Slight improvement with guaifenesin and pseudoephedrine. - Prescribe liquid azithromycin  (Z-Pak)- as directed. - Prescribe promethazine  DM 6.25-15 mg at bedtime prn. - Continue guaifenesin and pseudoephedrine OTC.     RTC in 4 months for your annual exam Angeline Laura, NP

## 2024-06-22 ENCOUNTER — Ambulatory Visit: Admitting: Internal Medicine

## 2024-06-22 ENCOUNTER — Encounter: Payer: Self-pay | Admitting: Internal Medicine

## 2024-06-22 VITALS — BP 134/88 | HR 86 | Ht 69.0 in | Wt 214.2 lb

## 2024-06-22 DIAGNOSIS — R55 Syncope and collapse: Secondary | ICD-10-CM | POA: Diagnosis not present

## 2024-06-22 NOTE — Progress Notes (Signed)
 Subjective:    Patient ID: Mac Dowdell Hamza, male    DOB: 08-30-1991, 33 y.o.   MRN: [id number]  HPI  Discussed the use of AI scribe software for clinical note transcription with the patient, who gave verbal consent to proceed.  Taahir Grisby Mcconnell is a 33 year old male who presents with a recent episode of syncope. He is accompanied by a family member who witnessed the event.  On Tuesday, he experienced a syncopal episode while using the bathroom. During defecation, he suddenly lost consciousness and fell to the floor. A family member, alerted by a loud noise, found him unconscious in the bathroom. He was estimated to be unconscious for less than five minutes.  Upon regaining consciousness, he was confused and startled, asking questions like 'What happened?' and 'Why am I here?' No dizziness, vision changes, or vomiting were noted. He hit his head during the fall, resulting in a mark on his forehead, but there was no bleeding. He has not complained of headaches or lightheadedness.  He had eaten earlier that day, though the exact timing of his last meal is unclear but he feels like it was shortly before he went to the restroom. He has not experienced any similar episodes since Tuesday. He does intermittently having episode of sweating and shakiness, concerned that this [place] be related to low blood sugar. He has not checked his blood sugar during these episodes.    Review of Systems     No past medical history on file.  Current Outpatient Medications  Medication Sig Dispense Refill   atorvastatin  (LIPITOR) 20 MG tablet Take 1 tablet (20 mg total) by mouth daily. (Patient not taking: Reported on 06/19/2024) 90 tablet 1   azithromycin  (ZITHROMAX ) 200 MG/5ML suspension Take 500 mg PO x 1 on day 1, 250 mg PO daily on days 2-4 37.5 mL 0   famotidine  (PEPCID ) 40 MG/5ML suspension Take 2.5 mLs (20 mg total) by mouth 2 (two) times daily. 450 mL 3   FLUoxetine  (PROZAC ) 10 MG capsule Take 1 capsule (10 mg  total) by mouth daily. (Patient not taking: Reported on 06/19/2024) 90 capsule 1   levocetirizine (XYZAL) 2.5 MG/5ML solution Take 2.5 mg by mouth every evening.     promethazine -dextromethorphan (PROMETHAZINE -DM) 6.25-15 MG/5ML syrup Take 5 mLs by mouth 4 (four) times daily as needed. 118 mL 0   No current facility-administered medications for this visit.    No Known Allergies  Family History  Problem Relation Age of Onset   Diabetes Mother    Hypertension Mother    Hyperlipidemia Mother    GER disease Mother    Anxiety disorder Mother    Depression Mother    Heart disease Father    Chronic bronchitis Father    Diabetes Father    Hyperlipidemia Father    Colon cancer Neg Hx    Prostate cancer Neg Hx     Social History   Socioeconomic History   Marital status: Single    Spouse name: Not on file   Number of children: Not on file   Years of education: Not on file   Highest education level: Not on file  Occupational History   Not on file  Tobacco Use   Smoking status: Never   Smokeless tobacco: Never  Substance and Sexual Activity   Alcohol use: No   Drug use: No   Sexual activity: Not on file  Other Topics Concern   Not on file  Social History Narrative  Not on file   Social Drivers of Health   Financial Resource Strain: Not on file  Food Insecurity: Not on file  Transportation Needs: Not on file  Physical Activity: Not on file  Stress: Not on file  Social Connections: Not on file  Intimate Partner Violence: Not on file     Constitutional: Denies fever, malaise, fatigue, headache or abrupt weight changes.  HEENT: Denies eye pain, eye redness, ear pain, ringing in the ears, wax buildup, runny nose, nasal congestion, bloody nose, or sore throat. Respiratory: Denies difficulty breathing, shortness of breath, cough or sputum production.   Cardiovascular: Patient reports chest tightness.  Denies chest pain, palpitations or swelling in the hands or feet.   Gastrointestinal: Denies abdominal pain, bloating, constipation, diarrhea or blood in the stool.  GU: Denies urgency, frequency, pain with urination, burning sensation, blood in urine, odor or discharge. Musculoskeletal: Denies decrease in range of motion, difficulty with gait, muscle pain or joint pain and swelling.  Skin: Pt reports abrasion to forehead. Denies redness, rashes, lesions or ulcercations.  Neurological: Pt reports syncope. Denies dizziness, difficulty with memory, difficulty with speech or problems with balance and coordination.  Psych: Patient has a history of anxiety and depression.  Denies SI/HI.  No other specific complaints in a complete review of systems (except as listed in HPI above).  Objective:   Physical Exam  BP 134/88 (BP Location: Right Arm, Patient Position: Sitting, Cuff Size: Normal)   Pulse 86   Ht 5' 9 (1.753 m)   Wt 214 lb 3.2 oz (97.2 kg)   SpO2 100%   BMI 31.63 kg/m     Wt Readings from Last 3 Encounters:  06/19/24 211 lb 3.2 oz (95.8 kg)  04/13/24 213 lb 12.8 oz (97 kg)  10/26/23 210 lb (95.3 kg)    General: Appears his stated age, obese, in NAD. Skin: Bruise noted to left side of forehead.  Head: Normal shape and size; Eyes: sclera white, conjunctiva pink, no icterus. PERRLA and EOM's intact. Cardiovascular: Normal rate and rhythm. S1,S2 noted.  No murmur, rubs or gallops noted.  Pulmonary/Chest: Normal effort and positive vesicular breath sounds. No respiratory distress. No wheezes, rales or ronchi noted.  Musculoskeletal:  No difficulty with gait.  Neurological: Alert and oriented. Coordination normal.     BMET    Component Value Date/Time   NA 138 04/13/2024 1109   K 4.5 04/13/2024 1109   CL 104 04/13/2024 1109   CO2 27 04/13/2024 1109   GLUCOSE 89 04/13/2024 1109   BUN 15 04/13/2024 1109   CREATININE 1.14 04/13/2024 1109   CALCIUM  9.5 04/13/2024 1109   GFRNONAA >60 06/06/2016 1620   GFRAA >60 06/06/2016 1620    Lipid  Panel     Component Value Date/Time   CHOL 290 (H) 04/13/2024 1109   TRIG 238 (H) 04/13/2024 1109   HDL 53 04/13/2024 1109   CHOLHDL 5.5 (H) 04/13/2024 1109   LDLCALC 193 (H) 04/13/2024 1109    CBC    Component Value Date/Time   WBC 6.0 04/13/2024 1109   RBC 5.19 04/13/2024 1109   HGB 15.5 04/13/2024 1109   HCT 44.9 04/13/2024 1109   PLT 283 04/13/2024 1109   MCV 86.5 04/13/2024 1109   MCH 29.9 04/13/2024 1109   MCHC 34.5 04/13/2024 1109   RDW 12.6 04/13/2024 1109   LYMPHSABS 1.9 10/23/2015 0816   MONOABS 0.3 10/23/2015 0816   EOSABS 0.4 10/23/2015 0816   BASOSABS 0.1 10/23/2015 0816  Hgb A1C Lab Results  Component Value Date   HGBA1C 5.6 04/13/2024            Assessment & Plan:  Assessment and Plan    Vasovagal syncope Syncope during defecation due to increased abdominal pressure, consistent with vasovagal syncope. No concussion or significant head trauma. No dizziness, vision changes, or vomiting. - Advise hydration and regular meals to prevent dehydration and maintain blood pressure. - Instruct to avoid straining during bowel movements. - Recommend over-the-counter stool softeners for constipation or hard stools. - Educate on syncope risk with activities increasing abdominal pressure.  Borderline elevated blood glucose Blood glucose levels indicate a borderline prediabetic state. Episodes of sweating and shaking Griffing relate to low blood sugar, especially with skipped meals. High sugar diet can cause rapid glucose fluctuations. - Advise consistent diet every few hours, focusing on protein and vegetables, reducing carbohydrates and sugars. - Educate on not exceeding four hours without eating to prevent hypoglycemia. - Discuss potential for low blood sugar in prediabetic state and importance of dietary management.         Schedule appointment for your annual exam Angeline Laura, NP

## 2024-06-22 NOTE — Patient Instructions (Signed)
Syncope, Adult  Syncope refers to a condition in which a person temporarily loses consciousness. Syncope may also be called fainting or passing out. It is caused by a sudden decrease in blood flow to the brain. This can happen for a variety of reasons. Most causes of syncope are not dangerous. It can be triggered by things such as needle sticks, seeing blood, pain, or intense emotion. However, syncope can also be a sign of a serious medical problem, such as a heart abnormality. Other causes can include dehydration, migraines, or taking medicines that lower blood pressure. Your health care provider may do tests to find the reason why you are having syncope. If you faint, get medical help right away. Call your local emergency services (911 in the [place] Follow these instructions at home: Pay attention to any changes in your symptoms. Take these actions to stay safe and to help relieve your symptoms: Knowing when you may be about to faint Signs that you may be about to faint include: Feeling dizzy, weak, light-headed, or like the room is spinning. Feeling nauseous. Seeing spots or seeing all white or all black in your field of vision. Having cold, clammy skin or feeling warm and sweaty. Hearing ringing in the ears (tinnitus). If you start to feel like you might faint, sit or lie down right away. If sitting, put your head down between your legs. If lying down, raise (elevate) your feet above the level of your heart. Breathe deeply and steadily. Wait until all the symptoms have passed. Have someone stay with you until you feel stable. Medicines Take over-the-counter and prescription medicines only as told by your health care provider. If you are taking blood pressure or heart medicine, get up slowly and take several minutes to sit and then stand. This can reduce dizziness and decrease the risk of syncope. Lifestyle Do not drive, use machinery, or play sports until your health care provider says it is  okay. Do not drink alcohol. Do not use any products that contain nicotine or tobacco. These products include cigarettes, chewing tobacco, and vaping devices, such as e-cigarettes. If you need help quitting, ask your health care provider. Avoid hot tubs and saunas. General instructions Talk with your health care provider about your symptoms. You may need to have testing to understand the cause of your syncope. Drink enough fluid to keep your urine pale yellow. Avoid prolonged standing. If you must stand for a long time, do movements such as: Moving your legs. Crossing your legs. Flexing and stretching your leg muscles. Squatting. Keep all follow-up visits. This is important. Contact a health care provider if: You have episodes of near fainting. Get help right away if: You faint. You hit your head or are injured after fainting. You have any of these symptoms that may indicate trouble with your heart: Fast or irregular heartbeats (palpitations). Unusual pain in your chest, abdomen, or back. Shortness of breath. You have a seizure. You have a severe headache. You are confused. You have vision problems. You have severe weakness or trouble walking. You are bleeding from your mouth or rectum, or you have black or tarry stool. These symptoms may represent a serious problem that is an emergency. Do not wait to see if your symptoms will go away. Get medical help right away. Call your local emergency services (911 in the [place] Do not drive yourself to the hospital. Summary Syncope refers to a condition in which a person temporarily loses consciousness. Syncope may also be called   fainting or passing out. It is caused by a sudden decrease in blood flow to the brain. Signs that you may be about to faint include dizziness, feeling light-headed, feeling nauseous, sudden vision changes, or cold, clammy skin. Even though most causes of syncope are not dangerous, syncope can be a sign of a serious  medical problem. Get help right away if you faint. If you start to feel like you might faint, sit or lie down right away. If sitting, put your head down between your legs. If lying down, raise (elevate) your feet above the level of your heart. This information is not intended to replace advice given to you by your health care provider. Make sure you discuss any questions you have with your health care provider. Document Revised: 01/23/2021 Document Reviewed: 01/23/2021 Elsevier Patient Education  2024 [employer]  

## 2024-07-14 ENCOUNTER — Encounter: Admitting: Internal Medicine

## 2024-10-11 ENCOUNTER — Ambulatory Visit: Payer: Self-pay

## 2024-10-11 NOTE — Telephone Encounter (Signed)
 FYI Only or Action Required?: FYI only for provider: appointment scheduled on 1/15.  Patient was last seen in primary care on 06/22/2024 by Antonette Angeline ORN, NP.  Called Nurse Triage reporting Abdominal Pain.  Symptoms began several days ago.  Interventions attempted: OTC medications: antacids.  Symptoms are: stable.  Triage Disposition: See Physician Within 24 Hours  Patient/caregiver understands and will follow disposition?: Yes   1 wk, stomache pain when eating, heartburn. Subsided, constipation x 2 d. Pain in arm   Copied from CRM #[id number]. Topic: Clinical - Red Word Triage >> Oct 11, 2024  9:23 AM Rosaria BRAVO wrote: Red Word that prompted transfer to Nurse Triage: Pt has abdominal and chest pain, possible acid reflux/constipation. Also has significant shoulder/arm pain on left side. Pt's mother Evlyn Fontanez is calling, pt is asleep. Reason for Disposition  [1] MODERATE pain (e.g., interferes with normal activities) AND [2] comes and goes (cramps) AND [3] present > 24 hours  (Exception: Pain with Vomiting or Diarrhea - see that Guideline.)  Answer Assessment - Initial Assessment Questions Also has mild left arm pain since moving furniture on Saturday.   1. LOCATION: Where does it hurt?      Upper abd 2. RADIATION: Does the pain shoot anywhere else? (e.g., chest, back)     no 3. ONSET: When did the pain begin? (e.g., minutes, hours or days ago)      saturday 4. SUDDEN: Gradual or sudden onset?     gradual 5. PATTERN Does the pain come and go, or is it constant?     intermittent 6. SEVERITY: How bad is the pain?  (e.g., Scale 1-10; mild, moderate, or severe)     moderate 7. RECURRENT SYMPTOM: Have you ever had this type of stomach pain before? If Yes, ask: When was the last time? and What happened that time?      no 8. AGGRAVATING FACTORS: Does anything seem to cause this pain? (e.g., foods, stress, alcohol)     food 9. CARDIAC SYMPTOMS: Do you have any  of the following symptoms: chest pain, difficulty breathing, sweating, nausea?     denies 10. OTHER SYMPTOMS: Do you have any other symptoms? (e.g., back pain, diarrhea, fever, urination pain, vomiting)       Mild constipation  Protocols used: Abdominal Pain - Upper-A-AH

## 2024-10-11 NOTE — Telephone Encounter (Signed)
 Will discuss at upcoming appointment.

## 2024-10-12 ENCOUNTER — Encounter: Payer: Self-pay | Admitting: Internal Medicine

## 2024-10-12 ENCOUNTER — Ambulatory Visit: Admitting: Internal Medicine

## 2024-10-12 VITALS — BP 120/84 | Ht 69.0 in | Wt 215.6 lb

## 2024-10-12 DIAGNOSIS — R1013 Epigastric pain: Secondary | ICD-10-CM

## 2024-10-12 DIAGNOSIS — K219 Gastro-esophageal reflux disease without esophagitis: Secondary | ICD-10-CM

## 2024-10-12 DIAGNOSIS — K5909 Other constipation: Secondary | ICD-10-CM

## 2024-10-12 DIAGNOSIS — R140 Abdominal distension (gaseous): Secondary | ICD-10-CM | POA: Diagnosis not present

## 2024-10-12 DIAGNOSIS — R142 Eructation: Secondary | ICD-10-CM | POA: Diagnosis not present

## 2024-10-12 MED ORDER — OMEPRAZOLE 2 MG/ML ORAL SUSPENSION: 40.0000 mg | Freq: Every day | ORAL | 0 refills | Status: AC

## 2024-10-12 NOTE — Progress Notes (Signed)
 "  Subjective:    Patient ID: Angel Montgomery, male    DOB: June 02, 1991, 34 y.o.   MRN: [id number]  HPI  Discussed the use of AI scribe software for clinical note transcription with the patient, who gave verbal consent to proceed.  Angel Montgomery is a 34 year old male who presents with epigastric fullness and reflux symptoms.  He has been experiencing a sensation of fullness in the epigastric region for a little over a week. This sensation sometimes radiates to his upper chest and throat, particularly when exacerbated.  He reports associated gassiness, belching and bloating.  He has not had any change or decrease in his appetite.  Symptoms worsen with spicy foods and lying down. No trouble swallowing, nausea, or vomiting.  His bowel movements were irregular, until the previous night when they improved. He experiences occasional constipation but denies blood in his stool. He uses Colace gummies for constipation management.  He has been taking Pepcid  (famotidine ) 40 mg once daily consistently.       Review of Systems     No past medical history on file.  Current Outpatient Medications  Medication Sig Dispense Refill   atorvastatin  (LIPITOR) 20 MG tablet Take 1 tablet (20 mg total) by mouth daily. (Patient not taking: Reported on 06/22/2024) 90 tablet 1   famotidine  (PEPCID ) 40 MG/5ML suspension Take 2.5 mLs (20 mg total) by mouth 2 (two) times daily. 450 mL 3   FLUoxetine  (PROZAC ) 10 MG capsule Take 1 capsule (10 mg total) by mouth daily. (Patient not taking: Reported on 06/22/2024) 90 capsule 1   levocetirizine (XYZAL) 2.5 MG/5ML solution Take 2.5 mg by mouth every evening.     No current facility-administered medications for this visit.    No Known Allergies  Family History  Problem Relation Age of Onset   Diabetes Mother    Hypertension Mother    Hyperlipidemia Mother    GER disease Mother    Anxiety disorder Mother    Depression Mother    Heart disease Father    Chronic  bronchitis Father    Diabetes Father    Hyperlipidemia Father    Colon cancer Neg Hx    Prostate cancer Neg Hx     Social History   Socioeconomic History   Marital status: Single    Spouse name: Not on file   Number of children: Not on file   Years of education: Not on file   Highest education level: Not on file  Occupational History   Not on file  Tobacco Use   Smoking status: Never   Smokeless tobacco: Never  Substance and Sexual Activity   Alcohol use: No   Drug use: No   Sexual activity: Not on file  Other Topics Concern   Not on file  Social History Narrative   Not on file   Social Drivers of Health   Tobacco Use: Low Risk (06/22/2024)   Patient History    Smoking Tobacco Use: Never    Smokeless Tobacco Use: Never    Passive Exposure: Not on file  Financial Resource Strain: Not on file  Food Insecurity: Not on file  Transportation Needs: Not on file  Physical Activity: Not on file  Stress: Not on file  Social Connections: Not on file  Intimate Partner Violence: Not on file  Depression (EYV7-0): Medium Risk (04/13/2024)   Depression (PHQ2-9)    PHQ-2 Score: 8  Alcohol Screen: Not on file  Housing: Not on file  Utilities:  Not on file  Health Literacy: Not on file     Constitutional: Denies fever, malaise, fatigue, headache or abrupt weight changes.  HEENT: Denies eye pain, eye redness, ear pain, ringing in the ears, wax buildup, runny nose, nasal congestion, bloody nose, or sore throat. Respiratory: Denies difficulty breathing, shortness of breath, cough or sputum production.   Cardiovascular: Patient reports chest tightness.  Denies chest pain, palpitations or swelling in the hands or feet.  Gastrointestinal: Patient reports gassiness, bloating, epigastric pain and constipation.  Denies diarrhea or blood in the stool.  GU: Denies urgency, frequency, pain with urination, burning sensation, blood in urine, odor or discharge. Neurological: Denies dizziness,  difficulty with memory, difficulty with speech or problems with balance and coordination.  Psych: Patient has a history of anxiety and depression.  Denies SI/HI.  No other specific complaints in a complete review of systems (except as listed in HPI above).  Objective:   Physical Exam  BP 120/84 (BP Location: Left Arm, Patient Position: Sitting, Cuff Size: Large)   Ht 5' 9 (1.753 m)   Wt 215 lb 9.6 oz (97.8 kg)   BMI 31.84 kg/m      Wt Readings from Last 3 Encounters:  06/22/24 214 lb 3.2 oz (97.2 kg)  06/19/24 211 lb 3.2 oz (95.8 kg)  04/13/24 213 lb 12.8 oz (97 kg)    General: Appears his stated age, obese, in NAD. Skin: Bruise noted to left side of forehead.  Head: Normal shape and size; Eyes: sclera white, conjunctiva pink, no icterus. PERRLA and EOM's intact. Cardiovascular: Normal rate and rhythm. S1,S2 noted.  No murmur, rubs or gallops noted.  Pulmonary/Chest: Normal effort and positive vesicular breath sounds. No respiratory distress. No wheezes, rales or ronchi noted. Abdomen: Soft and nontender.  Active bowel sounds. Musculoskeletal:  No difficulty with gait.  Neurological: Alert and oriented. Coordination normal.     BMET    Component Value Date/Time   NA 138 04/13/2024 1109   K 4.5 04/13/2024 1109   CL 104 04/13/2024 1109   CO2 27 04/13/2024 1109   GLUCOSE 89 04/13/2024 1109   BUN 15 04/13/2024 1109   CREATININE 1.14 04/13/2024 1109   CALCIUM  9.5 04/13/2024 1109   GFRNONAA >60 06/06/2016 1620   GFRAA >60 06/06/2016 1620    Lipid Panel     Component Value Date/Time   CHOL 290 (H) 04/13/2024 1109   TRIG 238 (H) 04/13/2024 1109   HDL 53 04/13/2024 1109   CHOLHDL 5.5 (H) 04/13/2024 1109   LDLCALC 193 (H) 04/13/2024 1109    CBC    Component Value Date/Time   WBC 6.0 04/13/2024 1109   RBC 5.19 04/13/2024 1109   HGB 15.5 04/13/2024 1109   HCT 44.9 04/13/2024 1109   PLT 283 04/13/2024 1109   MCV 86.5 04/13/2024 1109   MCH 29.9 04/13/2024 1109    MCHC 34.5 04/13/2024 1109   RDW 12.6 04/13/2024 1109   LYMPHSABS 1.9 10/23/2015 0816   MONOABS 0.3 10/23/2015 0816   EOSABS 0.4 10/23/2015 0816   BASOSABS 0.1 10/23/2015 0816    Hgb A1C Lab Results  Component Value Date   HGBA1C 5.6 04/13/2024            Assessment & Plan:   Assessment and Plan    Gastroesophageal reflux disease, epigastric pain, belching and bloating Epigastric fullness and reflux symptoms exacerbated by spicy foods and lying down. Differential includes H. pylori infection versus reflux. Famotidine  use precludes breath test for H. pylori. -  Ordered H. pylori stool test. - Prescribed omeprazole  40 mg in the morning. - Continue famotidine  40 mg at bedtime. - If omeprazole  unavailable, increase famotidine  to 40 mg twice daily. - Consider GI referral for endoscopy if symptoms persist.  Constipation Intermittent constipation with recent improvement. No hematochezia. - Use Colace gummies at bedtime. - Start Miralax if no bowel movement for 2-3 days.         Schedule appointment for your annual exam Angeline Laura, NP "

## 2024-10-12 NOTE — Patient Instructions (Signed)
 Helicobacter Pylori Antibodies Test Why am I having this test? This test is used to check for a type of bacteria called Helicobacter pylori (H. pylori). H. pylori can be found in the cells that line the stomach. Having high levels of H. pylori in your stomach puts you at risk for: Stomach ulcers and small bowel ulcers. Long-term (chronic) inflammation of the lining of the stomach. Ulcers in the part of the body that moves food from the mouth to the stomach (esophagus). Stomach cancer if the infection is not treated. Most people with H. pylori in their stomach have no symptoms. Your health care provider may ask you to have this test if you have symptoms of a stomach ulcer or small bowel ulcer, such as stomach pain before or after eating, heartburn, or nausea after eating. What is being tested? This test checks your blood for antibodies to the H. pylori bacteria. Antibodies are proteins made by your immune system to fight germs and infection. The test checks for antibodies that the immune system produces in response to infection with H. pylori. What kind of sample is taken?  A blood sample is required for this test. It is usually collected by inserting a needle into a blood vessel or by sticking a finger with a small needle. Tell a health care provider about: All medicines you are taking, including vitamins, herbs, eye drops, creams, and over-the-counter medicines. How are the results reported? Your test results will be reported as values that are categorized as positive, negative, or equivocal. Equivocal means that your results are neither positive nor negative. Your health care provider will compare your results to normal ranges that were established after testing a large group of people (reference ranges). Reference ranges may vary among labs and hospitals. For this test, common reference ranges for the two types of antibodies that may be tested are: IgG antibodies: Less than 0.75 units/mL. This  is negative. 1 unit/mL or greater. This is positive. 0.75-0.99 units/mL. This is equivocal. IgM antibodies: 30 units/mL or less. This is negative. 40 units/mL or greater. This is positive. 30.01-39.99 units/mL. This is equivocal. What do the results mean? Test results that are higher than normal, or positive, may indicate various health conditions, such as: Short-term or long-term irritation of the stomach lining (gastritis). Small bowel ulcer. Stomach ulcer. Stomach cancer. Talk with your health care provider about what your results mean. Questions to ask your health care provider Ask your health care provider, or the department that is doing the test: When will my results be ready? How will I get my results? What are my treatment options? What other tests do I need? What are my next steps? Summary This test is used to check for a type of bacteria called Helicobacter pylori (H. pylori). Having high levels of H. pylori in your stomach puts you at risk for ulcers in the gastrointestinal tract or stomach cancer. Most people with H. pylori in their stomach have no symptoms. Your health care provider may ask you to have this test if you have symptoms of a stomach ulcer or small bowel ulcer, such as stomach pain before or after eating, heartburn, or nausea after eating. This test checks your blood for antibodies to the H. pylori bacteria. Talk with your health care provider about what your results mean. This information is not intended to replace advice given to you by your health care provider. Make sure you discuss any questions you have with your health care provider. Document Revised: 04/02/2021  Document Reviewed: 04/02/2021 Elsevier Patient Education  2024 [employer].

## 2024-10-12 NOTE — Progress Notes (Unsigned)
 "  Subjective:    Patient ID: Angel Montgomery, male    DOB: August 09, 1991, 34 y.o.   MRN: [id number]  HPI    Review of Systems     No past medical history on file.  Current Outpatient Medications  Medication Sig Dispense Refill   atorvastatin  (LIPITOR) 20 MG tablet Take 1 tablet (20 mg total) by mouth daily. (Patient not taking: Reported on 06/22/2024) 90 tablet 1   famotidine  (PEPCID ) 40 MG/5ML suspension Take 2.5 mLs (20 mg total) by mouth 2 (two) times daily. 450 mL 3   FLUoxetine  (PROZAC ) 10 MG capsule Take 1 capsule (10 mg total) by mouth daily. (Patient not taking: Reported on 06/22/2024) 90 capsule 1   levocetirizine (XYZAL) 2.5 MG/5ML solution Take 2.5 mg by mouth every evening.     No current facility-administered medications for this visit.    No Known Allergies  Family History  Problem Relation Age of Onset   Diabetes Mother    Hypertension Mother    Hyperlipidemia Mother    GER disease Mother    Anxiety disorder Mother    Depression Mother    Heart disease Father    Chronic bronchitis Father    Diabetes Father    Hyperlipidemia Father    Colon cancer Neg Hx    Prostate cancer Neg Hx     Social History   Socioeconomic History   Marital status: Single    Spouse name: Not on file   Number of children: Not on file   Years of education: Not on file   Highest education level: Not on file  Occupational History   Not on file  Tobacco Use   Smoking status: Never   Smokeless tobacco: Never  Substance and Sexual Activity   Alcohol use: No   Drug use: No   Sexual activity: Not on file  Other Topics Concern   Not on file  Social History Narrative   Not on file   Social Drivers of Health   Tobacco Use: Low Risk (06/22/2024)   Patient History    Smoking Tobacco Use: Never    Smokeless Tobacco Use: Never    Passive Exposure: Not on file  Financial Resource Strain: Not on file  Food Insecurity: Not on file  Transportation Needs: Not on file  Physical  Activity: Not on file  Stress: Not on file  Social Connections: Not on file  Intimate Partner Violence: Not on file  Depression (PHQ2-9): Medium Risk (04/13/2024)   Depression (PHQ2-9)    PHQ-2 Score: 8  Alcohol Screen: Not on file  Housing: Not on file  Utilities: Not on file  Health Literacy: Not on file     Constitutional: Denies fever, malaise, fatigue, headache or abrupt weight changes.  HEENT: Denies eye pain, eye redness, ear pain, ringing in the ears, wax buildup, runny nose, nasal congestion, bloody nose, or sore throat. Respiratory: Denies difficulty breathing, shortness of breath, cough or sputum production.   Cardiovascular: Patient reports chest tightness.  Denies chest pain, palpitations or swelling in the hands or feet.  Gastrointestinal: Denies abdominal pain, bloating, constipation, diarrhea or blood in the stool.  GU: Denies urgency, frequency, pain with urination, burning sensation, blood in urine, odor or discharge. Musculoskeletal: Denies decrease in range of motion, difficulty with gait, muscle pain or joint pain and swelling.  Skin: Pt reports abrasion to forehead. Denies redness, rashes, lesions or ulcercations.  Neurological: Pt reports syncope. Denies dizziness, difficulty with memory, difficulty with speech or problems  with balance and coordination.  Psych: Patient has a history of anxiety and depression.  Denies SI/HI.  No other specific complaints in a complete review of systems (except as listed in HPI above).  Objective:   Physical Exam  There were no vitals taken for this visit.    Wt Readings from Last 3 Encounters:  06/22/24 214 lb 3.2 oz (97.2 kg)  06/19/24 211 lb 3.2 oz (95.8 kg)  04/13/24 213 lb 12.8 oz (97 kg)    General: Appears his stated age, obese, in NAD. Skin: Bruise noted to left side of forehead.  Head: Normal shape and size; Eyes: sclera white, conjunctiva pink, no icterus. PERRLA and EOM's intact. Cardiovascular: Normal rate  and rhythm. S1,S2 noted.  No murmur, rubs or gallops noted.  Pulmonary/Chest: Normal effort and positive vesicular breath sounds. No respiratory distress. No wheezes, rales or ronchi noted. GI: Patient reports abdominal pain and constipation.  Denies bloating, diarrhea or blood in stool. Musculoskeletal:  No difficulty with gait.  Neurological: Alert and oriented. Coordination normal.     BMET    Component Value Date/Time   NA 138 04/13/2024 1109   K 4.5 04/13/2024 1109   CL 104 04/13/2024 1109   CO2 27 04/13/2024 1109   GLUCOSE 89 04/13/2024 1109   BUN 15 04/13/2024 1109   CREATININE 1.14 04/13/2024 1109   CALCIUM  9.5 04/13/2024 1109   GFRNONAA >60 06/06/2016 1620   GFRAA >60 06/06/2016 1620    Lipid Panel     Component Value Date/Time   CHOL 290 (H) 04/13/2024 1109   TRIG 238 (H) 04/13/2024 1109   HDL 53 04/13/2024 1109   CHOLHDL 5.5 (H) 04/13/2024 1109   LDLCALC 193 (H) 04/13/2024 1109    CBC    Component Value Date/Time   WBC 6.0 04/13/2024 1109   RBC 5.19 04/13/2024 1109   HGB 15.5 04/13/2024 1109   HCT 44.9 04/13/2024 1109   PLT 283 04/13/2024 1109   MCV 86.5 04/13/2024 1109   MCH 29.9 04/13/2024 1109   MCHC 34.5 04/13/2024 1109   RDW 12.6 04/13/2024 1109   LYMPHSABS 1.9 10/23/2015 0816   MONOABS 0.3 10/23/2015 0816   EOSABS 0.4 10/23/2015 0816   BASOSABS 0.1 10/23/2015 0816    Hgb A1C Lab Results  Component Value Date   HGBA1C 5.6 04/13/2024            Assessment & Plan:      Schedule appointment for your annual exam Angeline Laura, NP  "

## 2024-10-13 ENCOUNTER — Telehealth: Payer: Self-pay

## 2024-10-13 ENCOUNTER — Other Ambulatory Visit (HOSPITAL_COMMUNITY): Payer: Self-pay

## 2024-10-13 NOTE — Telephone Encounter (Signed)
 Pharmacy Patient Advocate Encounter   Received notification from [facility] KEY that prior authorization for Konvomep 2-84 mg/mL susp is required/requested.   Insurance verification completed.   The patient is insured through [employer].   Per test claim: PA required; PA submitted to above mentioned insurance via Latent Key/confirmation #/EOC [id number] Status is pending

## 2024-10-13 NOTE — Telephone Encounter (Signed)
 Pharmacy Patient Advocate Encounter  Received notification from [employer] that Prior Authorization for Konvomep has been APPROVED from 10/13/24 to 10/13/25. Ran test claim, Copay is $4.00. This test claim was processed through [facility]- copay amounts Toothaker vary at other pharmacies due to pharmacy/plan contracts, or as the patient moves through the different stages of their insurance plan.   PA #/Case ID/Reference #: # [id number]

## 2024-10-19 ENCOUNTER — Ambulatory Visit: Payer: Self-pay | Admitting: Internal Medicine

## 2024-10-19 LAB — HELICOBACTER PYLORI  SPECIAL ANTIGEN
MICRO NUMBER:: 17501089
SPECIMEN QUALITY: ADEQUATE

## 2024-11-08 ENCOUNTER — Ambulatory Visit: Admitting: Internal Medicine
